# Patient Record
Sex: Female | Born: 1940
Health system: Southern US, Community
[De-identification: ages and names within clinical notes are randomized; demographics above are authoritative.]

## PROBLEM LIST (undated history)

## (undated) DIAGNOSIS — I10 Essential (primary) hypertension: Secondary | ICD-10-CM

## (undated) DIAGNOSIS — Z9289 Personal history of other medical treatment: Secondary | ICD-10-CM

## (undated) DIAGNOSIS — F32A Depression, unspecified: Secondary | ICD-10-CM

## (undated) DIAGNOSIS — I493 Ventricular premature depolarization: Secondary | ICD-10-CM

## (undated) DIAGNOSIS — E669 Obesity, unspecified: Secondary | ICD-10-CM

## (undated) DIAGNOSIS — I77810 Thoracic aortic ectasia: Secondary | ICD-10-CM

## (undated) DIAGNOSIS — E663 Overweight: Secondary | ICD-10-CM

## (undated) DIAGNOSIS — E7800 Pure hypercholesterolemia, unspecified: Secondary | ICD-10-CM

## (undated) DIAGNOSIS — M549 Dorsalgia, unspecified: Secondary | ICD-10-CM

## (undated) DIAGNOSIS — I480 Paroxysmal atrial fibrillation: Secondary | ICD-10-CM

## (undated) DIAGNOSIS — F329 Major depressive disorder, single episode, unspecified: Secondary | ICD-10-CM

## (undated) DIAGNOSIS — F419 Anxiety disorder, unspecified: Secondary | ICD-10-CM

## (undated) DIAGNOSIS — I5189 Other ill-defined heart diseases: Secondary | ICD-10-CM

## (undated) DIAGNOSIS — I482 Chronic atrial fibrillation, unspecified: Secondary | ICD-10-CM

## (undated) HISTORY — DX: Obesity, unspecified: E66.9

## (undated) HISTORY — PX: ABDOMINAL SURGERY: SHX537

## (undated) HISTORY — DX: Thoracic aortic ectasia: I77.810

## (undated) HISTORY — DX: Paroxysmal atrial fibrillation: I48.0

## (undated) HISTORY — DX: Chronic atrial fibrillation, unspecified: I48.20

## (undated) HISTORY — DX: Other ill-defined heart diseases: I51.89

## (undated) HISTORY — DX: Overweight: E66.3

## (undated) HISTORY — DX: Personal history of other medical treatment: Z92.89

## (undated) HISTORY — DX: Dorsalgia, unspecified: M54.9

---

## 2001-08-27 ENCOUNTER — Encounter: Payer: Self-pay | Admitting: Internal Medicine

## 2001-08-27 ENCOUNTER — Ambulatory Visit (HOSPITAL_COMMUNITY): Admission: RE | Admit: 2001-08-27 | Discharge: 2001-08-27 | Payer: Self-pay | Admitting: Internal Medicine

## 2004-05-22 ENCOUNTER — Emergency Department (HOSPITAL_COMMUNITY): Admission: EM | Admit: 2004-05-22 | Discharge: 2004-05-22 | Payer: Self-pay | Admitting: Emergency Medicine

## 2004-05-27 ENCOUNTER — Encounter: Admission: RE | Admit: 2004-05-27 | Discharge: 2004-05-27 | Payer: Self-pay | Admitting: Specialist

## 2008-04-14 ENCOUNTER — Emergency Department (HOSPITAL_COMMUNITY): Admission: EM | Admit: 2008-04-14 | Discharge: 2008-04-14 | Payer: Self-pay | Admitting: Emergency Medicine

## 2009-01-30 ENCOUNTER — Emergency Department (HOSPITAL_COMMUNITY): Admission: EM | Admit: 2009-01-30 | Discharge: 2009-01-30 | Payer: Self-pay | Admitting: Emergency Medicine

## 2010-04-05 LAB — POCT I-STAT, CHEM 8
BUN: 20 mg/dL (ref 6–23)
HCT: 42 % (ref 36.0–46.0)
Hemoglobin: 14.3 g/dL (ref 12.0–15.0)

## 2010-04-05 LAB — POCT CARDIAC MARKERS: Troponin i, poc: <0.05 ng/mL (ref 0.00–0.09)

## 2010-07-08 ENCOUNTER — Other Ambulatory Visit: Payer: Self-pay | Admitting: Internal Medicine

## 2010-07-08 DIAGNOSIS — Z1231 Encounter for screening mammogram for malignant neoplasm of breast: Secondary | ICD-10-CM

## 2010-11-23 ENCOUNTER — Ambulatory Visit: Payer: Self-pay

## 2011-04-07 ENCOUNTER — Encounter (HOSPITAL_COMMUNITY): Payer: Self-pay | Admitting: Emergency Medicine

## 2011-04-07 ENCOUNTER — Emergency Department (HOSPITAL_COMMUNITY)
Admission: EM | Admit: 2011-04-07 | Discharge: 2011-04-08 | Disposition: A | Discharge status: Home / Self Care | Payer: Medicare Other | Attending: Emergency Medicine | Admitting: Emergency Medicine

## 2011-04-07 DIAGNOSIS — F341 Dysthymic disorder: Secondary | ICD-10-CM | POA: Insufficient documentation

## 2011-04-07 DIAGNOSIS — M25519 Pain in unspecified shoulder: Secondary | ICD-10-CM | POA: Insufficient documentation

## 2011-04-07 DIAGNOSIS — E7800 Pure hypercholesterolemia, unspecified: Secondary | ICD-10-CM | POA: Insufficient documentation

## 2011-04-07 DIAGNOSIS — I10 Essential (primary) hypertension: Secondary | ICD-10-CM | POA: Insufficient documentation

## 2011-04-07 DIAGNOSIS — K219 Gastro-esophageal reflux disease without esophagitis: Secondary | ICD-10-CM

## 2011-04-07 DIAGNOSIS — I4891 Unspecified atrial fibrillation: Secondary | ICD-10-CM | POA: Insufficient documentation

## 2011-04-07 DIAGNOSIS — Z7982 Long term (current) use of aspirin: Secondary | ICD-10-CM | POA: Insufficient documentation

## 2011-04-07 DIAGNOSIS — R109 Unspecified abdominal pain: Secondary | ICD-10-CM | POA: Insufficient documentation

## 2011-04-07 DIAGNOSIS — Z7901 Long term (current) use of anticoagulants: Secondary | ICD-10-CM | POA: Insufficient documentation

## 2011-04-07 DIAGNOSIS — Z79899 Other long term (current) drug therapy: Secondary | ICD-10-CM | POA: Insufficient documentation

## 2011-04-07 HISTORY — DX: Major depressive disorder, single episode, unspecified: F32.9

## 2011-04-07 HISTORY — DX: Anxiety disorder, unspecified: F41.9

## 2011-04-07 HISTORY — DX: Pure hypercholesterolemia, unspecified: E78.00

## 2011-04-07 HISTORY — DX: Depression, unspecified: F32.A

## 2011-04-07 HISTORY — DX: Essential (primary) hypertension: I10

## 2011-04-07 NOTE — ED Notes (Signed)
PT. REPORTS ELEVATED BLOOD PRESSURE AT HOME THIS EVENING = 160/102 , DENIES CHEST PAIN OR SOB . SEEN BY HIS PCP YESTERDAY STARTED ON COUMADIN , DENIES HEADACHE OR NAUSEA.

## 2011-04-08 LAB — BASIC METABOLIC PANEL
BUN: 15 mg/dL (ref 6–23)
CO2: 28 mEq/L (ref 19–32)
Chloride: 99 mEq/L (ref 96–112)
GFR calc non Af Amer: 84 mL/min — ABNORMAL LOW (ref >90)
Glucose, Bld: 100 mg/dL — ABNORMAL HIGH (ref 70–99)
Sodium: 138 mEq/L (ref 135–145)

## 2011-04-08 LAB — CBC
MCH: 27.9 pg (ref 26.0–34.0)
MCHC: 34.8 g/dL (ref 30.0–36.0)
MCV: 80.1 fL (ref 78.0–100.0)
Platelets: 379 10*3/uL (ref 150–400)
WBC: 11.2 10*3/uL — ABNORMAL HIGH (ref 4.0–10.5)

## 2011-04-08 LAB — DIFFERENTIAL
Basophils Absolute: 0.1 10*3/uL (ref 0.0–0.1)
Eosinophils Relative: 2 % (ref 0–5)
Lymphocytes Relative: 32 % (ref 12–46)
Monocytes Absolute: 0.8 10*3/uL (ref 0.1–1.0)
Neutro Abs: 6.5 10*3/uL (ref 1.7–7.7)
Neutrophils Relative %: 58 % (ref 43–77)

## 2011-04-08 LAB — POCT I-STAT TROPONIN I: Troponin i, poc: 0 ng/mL (ref 0.00–0.08)

## 2011-04-08 LAB — PROTIME-INR: INR: 1.14 (ref 0.00–1.49)

## 2011-04-08 MED ORDER — FAMOTIDINE 20 MG PO TABS: 20.0000 mg | ORAL_TABLET | Freq: Two times a day (BID) | ORAL | Status: DC | PRN

## 2011-04-08 MED ORDER — GI COCKTAIL ~~LOC~~
30.0000 mL | Freq: Once | ORAL | Status: AC
  Administered 2011-04-08: 30 mL via ORAL
  Filled 2011-04-08: qty 30

## 2011-04-08 NOTE — ED Notes (Signed)
rx x 1, pt voiced understanding to f/u with PCP today

## 2011-04-08 NOTE — ED Notes (Signed)
Pt c/o intermittent left arm aching that lasts for 15-20 min at a time.  Tonight, noticed elevated BP and was worried.  Was recently seen at PCP and dx with AFib and put on Coumadin.  Also hx of recent sinus infection, taking azithromycin.

## 2011-04-08 NOTE — Discharge Instructions (Signed)
Diet for GERD or PUD Nutrition therapy can help ease the discomfort of gastroesophageal reflux disease (GERD) and peptic ulcer disease (PUD).  HOME CARE INSTRUCTIONS   Eat your meals slowly, in a relaxed setting.   Eat 5 to 6 small meals per day.   If a food causes distress, stop eating it for a period of time.  FOODS TO AVOID  Coffee, regular or decaffeinated.   Cola beverages, regular or low calorie.   Tea, regular or decaffeinated.   Pepper.   Cocoa.   High fat foods, including meats.   Butter, margarine, hydrogenated oil (trans fats).   Peppermint or spearmint (if you have GERD).   Fruits and vegetables if not tolerated.   Alcohol.   Nicotine (smoking or chewing). This is one of the most potent stimulants to acid production in the gastrointestinal tract.   Any food that seems to aggravate your condition.  If you have questions regarding your diet, ask your caregiver or a registered dietitian. TIPS  Lying flat may make symptoms worse. Keep the head of your bed raised 6 to 9 inches (15 to 23 cm) by using a foam wedge or blocks under the legs of the bed.   Do not lay down until 3 hours after eating a meal.   Daily physical activity may help reduce symptoms.  MAKE SURE YOU:   Understand these instructions.   Will watch your condition.   Will get help right away if you are not doing well or get worse.  Document Released: 01/04/2005 Document Revised: 12/24/2010 Document Reviewed: 11/20/2010 [facility] Patient Information 2012 [place], [place].Gastroesophageal Reflux Disease, Adult Gastroesophageal reflux disease (GERD) happens when acid from your stomach flows up into the esophagus. When acid comes in contact with the esophagus, the acid causes soreness (inflammation) in the esophagus. Over time, GERD may create small holes (ulcers) in the lining of the esophagus. CAUSES   Increased body weight. This puts pressure on the stomach, making acid rise from the stomach into  the esophagus.   Smoking. This increases acid production in the stomach.   Drinking alcohol. This causes decreased pressure in the lower esophageal sphincter (valve or ring of muscle between the esophagus and stomach), allowing acid from the stomach into the esophagus.   Late evening meals and a full stomach. This increases pressure and acid production in the stomach.   A malformed lower esophageal sphincter.  Sometimes, no cause is found. SYMPTOMS   Burning pain in the lower part of the mid-chest behind the breastbone and in the mid-stomach area. This may occur twice a week or more often.   Trouble swallowing.   Sore throat.   Dry cough.   Asthma-like symptoms including chest tightness, shortness of breath, or wheezing.  DIAGNOSIS  Your caregiver may be able to diagnose GERD based on your symptoms. In some cases, X-rays and other tests may be done to check for complications or to check the condition of your stomach and esophagus. TREATMENT  Your caregiver may recommend over-the-counter or prescription medicines to help decrease acid production. Ask your caregiver before starting or adding any new medicines.  HOME CARE INSTRUCTIONS   Change the factors that you can control. Ask your caregiver for guidance concerning weight loss, quitting smoking, and alcohol consumption.   Avoid foods and drinks that make your symptoms worse, such as:   Caffeine or alcoholic drinks.   Chocolate.   Peppermint or mint flavorings.   Garlic and onions.   Spicy foods.  Citrus fruits, such as oranges, lemons, or limes.   Tomato-based foods such as sauce, chili, salsa, and pizza.   Fried and fatty foods.   Avoid lying down for the 3 hours prior to your bedtime or prior to taking a nap.   Eat small, frequent meals instead of large meals.   Wear loose-fitting clothing. Do not wear anything tight around your waist that causes pressure on your stomach.   Raise the head of your bed 6 to 8  inches with wood blocks to help you sleep. Extra pillows will not help.   Only take over-the-counter or prescription medicines for pain, discomfort, or fever as directed by your caregiver.   Do not take aspirin, ibuprofen, or other nonsteroidal anti-inflammatory drugs (NSAIDs).  SEEK IMMEDIATE MEDICAL CARE IF:   You have pain in your arms, neck, jaw, teeth, or back.   Your pain increases or changes in intensity or duration.   You develop nausea, vomiting, or sweating (diaphoresis).   You develop shortness of breath, or you faint.   Your vomit is green, yellow, black, or looks like coffee grounds or blood.   Your stool is red, bloody, or black.  These symptoms could be signs of other problems, such as heart disease, gastric bleeding, or esophageal bleeding. MAKE SURE YOU:   Understand these instructions.   Will watch your condition.   Will get help right away if you are not doing well or get worse.  Document Released: 10/14/2004 Document Revised: 12/24/2010 Document Reviewed: 07/24/2010 [facility] Patient Information 2012 [place], [place].Heartburn Heartburn is a painful, burning sensation in the chest. It may feel worse in certain positions, such as lying down or bending over. It is caused by stomach acid backing up into the tube that carries food from the mouth down to the stomach (lower esophagus).  CAUSES   Large meals.   Certain foods and drinks.   Exercise.   Increased acid production.   Being overweight or obese.   Certain medicines.  SYMPTOMS   Burning pain in the chest or lower throat.   Bitter taste in the mouth.   Coughing.  DIAGNOSIS  If the usual treatments for heartburn do not improve your symptoms, then tests may be done to see if there is another condition present. Possible tests may include:  X-rays.   Endoscopy. This is when a tube with a light and a camera on the end is used to examine the esophagus and the stomach.   A test to measure the amount  of acid in the esophagus (pH test).   A test to see if the esophagus is working properly (esophageal manometry).   Blood, breath, or stool tests to check for bacteria that cause ulcers.  TREATMENT   Your caregiver may tell you to use certain over-the-counter medicines (antacids, acid reducers) for mild heartburn.   Your caregiver may prescribe medicines to decrease the acid in your stomach or protect your stomach lining.   Your caregiver may recommend certain diet changes.   For severe cases, your caregiver may recommend that the head of your bed be elevated on blocks. (Sleeping with more pillows is not an effective treatment as it only changes the position of your head and does not improve the main problem of stomach acid refluxing into the esophagus.)  HOME CARE INSTRUCTIONS   Take all medicines as directed by your caregiver.   Raise the head of your bed by putting blocks under the legs if instructed to by your caregiver.  Do not exercise right after eating.   Avoid eating 2 or 3 hours before bed. Do not lie down right after eating.   Eat small meals throughout the day instead of 3 large meals.   Stop smoking if you smoke.   Maintain a healthy weight.   Identify foods and beverages that make your symptoms worse and avoid them. Foods you may want to avoid include:   Peppers.   Chocolate.   High-fat foods, including fried foods.   Spicy foods.   Garlic and onions.   Citrus fruits, including oranges, grapefruit, lemons, and limes.   Food containing tomatoes or tomato products.   Mint.   Carbonated drinks, caffeinated drinks, and alcohol.   Vinegar.  SEEK IMMEDIATE MEDICAL CARE IF:  You have severe chest pain that goes down your arm or into your jaw or neck.   You feel sweaty, dizzy, or lightheaded.   You are short of breath.   You vomit blood.   You have difficulty or pain with swallowing.   You have bloody or black, tarry stools.   You have episodes  of heartburn more than 3 times a week for more than 2 weeks.  MAKE SURE YOU:  Understand these instructions.   Will watch your condition.   Will get help right away if you are not doing well or get worse.  Document Released: 05/23/2008 Document Revised: 12/24/2010 Document Reviewed: 06/21/2010 [facility] Patient Information 2012 [place], [place].Hypertension Information As your heart beats, it forces blood through your arteries. This force is your blood pressure. If the pressure is too high, it is called hypertension (HTN) or high blood pressure. HTN is dangerous because you may have it and not know it. High blood pressure may mean that your heart has to work harder to pump blood. Your arteries may be narrow or stiff. The extra work puts you at risk for heart disease, stroke, and other problems.  Blood pressure consists of two numbers, a higher number over a lower, 110/72, for example. It is stated as "110 over 72." The ideal is below 120 for the top number (systolic) and under 80 for the bottom (diastolic).  You should pay close attention to your blood pressure if you have certain conditions such as:  Heart failure.   Prior heart attack.   Diabetes   Chronic kidney disease.   Prior stroke.   Multiple risk factors for heart disease.  To see if you have HTN, your blood pressure should be measured while you are seated with your arm held at the level of the heart. It should be measured at least twice. A one-time elevated blood pressure reading (especially in the Emergency Department) does not mean that you need treatment. There may be conditions in which the blood pressure is different between your right and left arms. It is important to see your caregiver soon for a recheck. Most people have essential hypertension which means that there is not a specific cause. This type of high blood pressure may be lowered by changing lifestyle factors such as:  Stress.   Smoking.   Lack of exercise.    Excessive weight.   Drug/tobacco/alcohol use.   Eating less salt.  Most people do not have symptoms from high blood pressure until it has caused damage to the body. Effective treatment can often prevent, delay or reduce that damage. TREATMENT  Treatment for high blood pressure, when a cause has been identified, is directed at the cause. There are a large number  of medications to treat HTN. These fall into several categories, and your caregiver will help you select the medicines that are best for you. Medications may have side effects. You should review side effects with your caregiver. If your blood pressure stays high after you have made lifestyle changes or started on medicines,   Your medication(s) may need to be changed.   Other problems may need to be addressed.   Be certain you understand your prescriptions, and know how and when to take your medicine.   Be sure to follow up with your caregiver within the time frame advised (usually within two weeks) to have your blood pressure rechecked and to review your medications.   If you are taking more than one medicine to lower your blood pressure, make sure you know how and at what times they should be taken. Taking two medicines at the same time can result in blood pressure that is too low.  Document Released: 03/09/2005 Document Revised: 09/16/2010 Document Reviewed: 03/16/2007 [facility] Patient Information 2012 [place], [place].

## 2011-04-11 ENCOUNTER — Encounter (HOSPITAL_COMMUNITY): Payer: Self-pay | Admitting: *Deleted

## 2011-04-11 ENCOUNTER — Emergency Department (HOSPITAL_COMMUNITY)
Admission: EM | Admit: 2011-04-11 | Discharge: 2011-04-12 | Disposition: A | Discharge status: Home / Self Care | Payer: Medicare Other | Attending: Emergency Medicine | Admitting: Emergency Medicine

## 2011-04-11 DIAGNOSIS — Z7901 Long term (current) use of anticoagulants: Secondary | ICD-10-CM | POA: Insufficient documentation

## 2011-04-11 DIAGNOSIS — I10 Essential (primary) hypertension: Secondary | ICD-10-CM | POA: Insufficient documentation

## 2011-04-11 DIAGNOSIS — Z79899 Other long term (current) drug therapy: Secondary | ICD-10-CM | POA: Insufficient documentation

## 2011-04-11 NOTE — ED Provider Notes (Signed)
History     CSN: [id number]  Arrival date & time 04/07/11  2345   First MD Initiated Contact with Patient 04/08/11 [phone]      Chief Complaint  Patient presents with  . Hypertension    (Consider location/radiation/quality/duration/timing/severity/associated sxs/prior treatment) HPI Comments: The patient presents for evaluation of elevated blood pressure found incidentally at home.  She reports occasional vague ache of left shoulder and "upset stomach, like gas pains" at home as well.  She denies dyspnea, nausea, diaphoresis, chest pain, palpitations, or lightheadedness, syncope or near-syncope.  The patient was recently diagnosed with atrial fibrillation by her PCP approx. 3-4 days ago.  She was taken off of her HCTZ and her metoprolol was increased, she was started on warfarin, and set up with a cardiology appointment for tomorrow.  She is in no distress, reporting only "upset stomach" at the time of evaluation in the ED.  Patient is a 71 y.o. female presenting with hypertension.  Hypertension Pertinent negatives include no chest pain, no headaches and no shortness of breath.    Past Medical History  Diagnosis Date  . Hypertension   . Hypercholesteremia   . Depression   . Anxiety     History reviewed. No pertinent past surgical history.  No family history on file.  History  Substance Use Topics  . Smoking status: Never Smoker   . Smokeless tobacco: Not on file  . Alcohol Use: No    OB History    Grav Para Term Preterm Abortions TAB SAB Ect Mult Living                  Review of Systems  Constitutional: Negative for diaphoresis, activity change, fatigue and unexpected weight change.  HENT: Negative for hearing loss, nosebleeds and tinnitus.   Eyes: Negative for visual disturbance.  Respiratory: Negative for cough, chest tightness, shortness of breath and wheezing.   Cardiovascular: Negative for chest pain, palpitations and leg swelling.  Gastrointestinal: Negative.    Genitourinary: Negative for hematuria.  Musculoskeletal: Negative.   Skin: Negative.   Neurological: Negative for dizziness, tremors, seizures, syncope, facial asymmetry, speech difficulty, weakness, light-headedness, numbness and headaches.  Hematological: Does not bruise/bleed easily.  Psychiatric/Behavioral: Negative.     Allergies  Review of patient's allergies indicates no known allergies.  Home Medications   Current Outpatient Rx  Name Route Sig Dispense Refill  . ACETAMINOPHEN 325 MG PO TABS Oral Take 650 mg by mouth 2 (two) times daily. For pain    . ALPRAZOLAM 0.25 MG PO TABS Oral Take 0.125 mg by mouth daily as needed. For anxiety    . ASPIRIN EC 81 MG PO TBEC Oral Take 81 mg by mouth daily.    . AZITHROMYCIN 250 MG PO TABS Oral Take 250 mg by mouth daily.    Marland Kitchen CITALOPRAM HYDROBROMIDE 20 MG PO TABS Oral Take 20 mg by mouth daily.    Marland Kitchen SIMVASTATIN 20 MG PO TABS Oral Take 20 mg by mouth every evening.    . WARFARIN SODIUM 5 MG PO TABS Oral Take 5 mg by mouth daily.    Marland Kitchen FAMOTIDINE 20 MG PO TABS Oral Take 1 tablet (20 mg total) by mouth 2 (two) times daily as needed for heartburn (upset stomach). 14 tablet 0    BP 125/79  Pulse 70  Temp(Src) 98.3 F (36.8 C) (Oral)  Resp 16  SpO2 96%  Physical Exam  Nursing note and vitals reviewed. Constitutional: She is oriented to person, place, and time.  She appears well-developed and well-nourished. No distress.  HENT:  Head: Normocephalic and atraumatic.  Right Ear: External ear normal.  Left Ear: External ear normal.  Nose: Nose normal.  Mouth/Throat: Oropharynx is clear and moist.  Eyes: Conjunctivae, EOM and lids are normal. Pupils are equal, round, and reactive to light.  Fundoscopic exam:      The right eye shows no exudate, no hemorrhage and no papilledema.       The left eye shows no exudate, no hemorrhage and no papilledema.  Neck: Normal range of motion. Neck supple. No JVD present. No tracheal deviation present.    Cardiovascular: Normal rate, regular rhythm, S1 normal, S2 normal, normal heart sounds and intact distal pulses.  Exam reveals no gallop and no friction rub.   No murmur heard. Pulmonary/Chest: Effort normal and breath sounds normal. No stridor. No respiratory distress. She has no wheezes. She has no rales.  Abdominal: Soft. Bowel sounds are normal. She exhibits no distension. There is no tenderness.  Musculoskeletal: Normal range of motion. She exhibits no edema and no tenderness.  Neurological: She is alert and oriented to person, place, and time. She has normal reflexes. She displays no tremor. No cranial nerve deficit or sensory deficit. She exhibits normal muscle tone. Coordination and gait normal. GCS eye subscore is 4. GCS verbal subscore is 5. GCS motor subscore is 6.  Skin: Skin is warm and dry. No rash noted. She is not diaphoretic. No erythema. No pallor.  Psychiatric: She has a normal mood and affect. Her behavior is normal. Judgment and thought content normal.    ED Course  Procedures (including critical care time)   Date: 04/11/2011  Rate: 68  Rhythm: normal sinus rhythm  QRS Axis: normal  Intervals: normal  ST/T Wave abnormalities: nonspecific ST/T changes  Conduction Disutrbances:none  Narrative Interpretation: Non-provocative EKG  Old EKG Reviewed: unchanged    Labs Reviewed  CBC - Abnormal; Notable for the following:    WBC 11.2 (*)    All other components within normal limits  BASIC METABOLIC PANEL - Abnormal; Notable for the following:    Glucose, Bld 100 (*)    GFR calc non Af Amer 84 (*)    All other components within normal limits  DIFFERENTIAL  PROTIME-INR  APTT  POCT I-STAT TROPONIN I  LAB REPORT - SCANNED   No results found.   1. Hypertension   2. GERD (gastroesophageal reflux disease)       MDM  The patient's primary concern prompting tonight's visit was her blood pressure, which currently shows mild-moderate hypertension but in and of  itself is not worrisome for causing any acute end organ damage.  The extent of her hypertension does not warrant emergency antihypertensive administration nor change in her long term antihypertensive management, but rather, that of follow up with her PCP for re-check outside of the ED and any management changes to be made in that venue.  She appears to be out of atrial fibrilation, possibly due to her increase in metoprolol vs. Paroxysmal atrial fibrillation.  I have directed her to follow up with cardiology as scheduled for further evaluation and management.  She demonstrates no signs of myocardial ischemia/infarction.  While her complaints of epigastric discomfort (mild in severity) and fleeting, intermittent left shoulder discomfort are given credence for possible atypical MI symptoms, my evaluation at this time does not suggest that this is the case.        Felisa Bonier, MD 04/11/11 [phone]

## 2011-04-11 NOTE — ED Notes (Signed)
Pt measured her BP and 187/98 and she decided to come be evaluated here.  No symptoms with this.  Pt is alert and oriented, no distress

## 2011-04-12 LAB — CBC
HCT: 37.8 % (ref 36.0–46.0)
Hemoglobin: 12.8 g/dL (ref 12.0–15.0)
MCV: 81.6 fL (ref 78.0–100.0)

## 2011-04-12 LAB — BASIC METABOLIC PANEL
Calcium: 9.4 mg/dL (ref 8.4–10.5)
Creatinine, Ser: 0.78 mg/dL (ref 0.50–1.10)
GFR calc Af Amer: >90 mL/min (ref >90)
GFR calc non Af Amer: 84 mL/min — ABNORMAL LOW (ref >90)
Sodium: 136 mEq/L (ref 135–145)

## 2011-04-12 LAB — DIFFERENTIAL
Basophils Absolute: 0.1 10*3/uL (ref 0.0–0.1)
Eosinophils Absolute: 0.2 10*3/uL (ref 0.0–0.7)
Lymphs Abs: 3.6 10*3/uL (ref 0.7–4.0)
Monocytes Absolute: 0.9 10*3/uL (ref 0.1–1.0)
Monocytes Relative: 8 % (ref 3–12)

## 2011-04-12 LAB — URINALYSIS, ROUTINE W REFLEX MICROSCOPIC
Bilirubin Urine: NEGATIVE
Ketones, ur: NEGATIVE mg/dL
Nitrite: NEGATIVE
Protein, ur: NEGATIVE mg/dL
Specific Gravity, Urine: 1.013 (ref 1.005–1.030)
pH: 5.5 (ref 5.0–8.0)

## 2011-04-12 NOTE — ED Notes (Signed)
I gave the patient a warm blanket. 

## 2011-04-12 NOTE — Discharge Instructions (Signed)
Called Dr. polite tomorrow to be seen early this week. Return to ED for new or worsening symptoms. Arterial Hypertension Arterial hypertension (high blood pressure) is a condition of elevated pressure in your blood vessels. Hypertension over a long period of time is a risk factor for strokes, heart attacks, and heart failure. It is also the leading cause of kidney (renal) failure.  CAUSES   In Adults -- Over 90% of all hypertension has no known cause. This is called essential or primary hypertension. In the other 10% of people with hypertension, the increase in blood pressure is caused by another disorder. This is called secondary hypertension. Important causes of secondary hypertension are:   Heavy alcohol use.   Obstructive sleep apnea.   Hyperaldosterosim (Conn's syndrome).   Steroid use.   Chronic kidney failure.   Hyperparathyroidism.   Medications.   Renal artery stenosis.   Pheochromocytoma.   Cushing's disease.   Coarctation of the aorta.   Scleroderma renal crisis.   Licorice (in excessive amounts).   Drugs (cocaine, methamphetamine).  Your caregiver can explain any items above that apply to you.  In Children -- Secondary hypertension is more common and should always be considered.   Pregnancy -- Few women of childbearing age have high blood pressure. However, up to 10% of them develop hypertension of pregnancy. Generally, this will not harm the woman. It may be a sign of 3 complications of pregnancy: preeclampsia, HELLP syndrome, and eclampsia. Follow up and control with medication is necessary.  SYMPTOMS   This condition normally does not produce any noticeable symptoms. It is usually found during a routine exam.   Malignant hypertension is a late problem of high blood pressure. It may have the following symptoms:   Headaches.   Blurred vision.   End-organ damage (this means your kidneys, heart, lungs, and other organs are being damaged).   Stressful  situations can increase the blood pressure. If a person with normal blood pressure has their blood pressure go up while being seen by their caregiver, this is often termed "white coat hypertension." Its importance is not known. It may be related with eventually developing hypertension or complications of hypertension.   Hypertension is often confused with mental tension, stress, and anxiety.  DIAGNOSIS  The diagnosis is made by 3 separate blood pressure measurements. They are taken at least 1 week apart from each other. If there is organ damage from hypertension, the diagnosis may be made without repeat measurements. Hypertension is usually identified by having blood pressure readings:  Above 140/90 mmHg measured in both arms, at 3 separate times, over a couple weeks.   Over 130/80 mmHg should be considered a risk factor and may require treatment in patients with diabetes.  Blood pressure readings over 120/80 mmHg are called "pre-hypertension" even in non-diabetic patients. To get a true blood pressure measurement, use the following guidelines. Be aware of the factors that can alter blood pressure readings.  Take measurements at least 1 hour after caffeine.   Take measurements 30 minutes after smoking and without any stress. This is another reason to quit smoking - it raises your blood pressure.   Use a proper cuff size. Ask your caregiver if you are not sure about your cuff size.   Most home blood pressure cuffs are automatic. They will measure systolic and diastolic pressures. The systolic pressure is the pressure reading at the start of sounds. Diastolic pressure is the pressure at which the sounds disappear. If you are elderly, measure  pressures in multiple postures. Try sitting, lying or standing.   Sit at rest for a minimum of 5 minutes before taking measurements.   You should not be on any medications like decongestants. These are found in many cold medications.   Record your blood  pressure readings and review them with your caregiver.  If you have hypertension:  Your caregiver may do tests to be sure you do not have secondary hypertension (see "causes" above).   Your caregiver may also look for signs of metabolic syndrome. This is also called Syndrome X or Insulin Resistance Syndrome. You may have this syndrome if you have type 2 diabetes, abdominal obesity, and abnormal blood lipids in addition to hypertension.   Your caregiver will take your medical and family history and perform a physical exam.   Diagnostic tests may include blood tests (for glucose, cholesterol, potassium, and kidney function), a urinalysis, or an EKG. Other tests may also be necessary depending on your condition.  PREVENTION  There are important lifestyle issues that you can adopt to reduce your chance of developing hypertension:  Maintain a normal weight.   Limit the amount of salt (sodium) in your diet.   Exercise often.   Limit alcohol intake.   Get enough potassium in your diet. Discuss specific advice with your caregiver.   Follow a DASH diet (dietary approaches to stop hypertension). This diet is rich in fruits, vegetables, and low-fat dairy products, and avoids certain fats.  PROGNOSIS  Essential hypertension cannot be cured. Lifestyle changes and medical treatment can lower blood pressure and reduce complications. The prognosis of secondary hypertension depends on the underlying cause. Many people whose hypertension is controlled with medicine or lifestyle changes can live a normal, healthy life.  RISKS AND COMPLICATIONS  While high blood pressure alone is not an illness, it often requires treatment due to its short- and long-term effects on many organs. Hypertension increases your risk for:  CVAs or strokes (cerebrovascular accident).   Heart failure due to chronically high blood pressure (hypertensive cardiomyopathy).   Heart attack (myocardial infarction).   Damage to the  retina (hypertensive retinopathy).   Kidney failure (hypertensive nephropathy).  Your caregiver can explain list items above that apply to you. Treatment of hypertension can significantly reduce the risk of complications. TREATMENT   For overweight patients, weight loss and regular exercise are recommended. Physical fitness lowers blood pressure.   Mild hypertension is usually treated with diet and exercise. A diet rich in fruits and vegetables, fat-free dairy products, and foods low in fat and salt (sodium) can help lower blood pressure. Decreasing salt intake decreases blood pressure in a 1/3 of people.   Stop smoking if you are a smoker.  The steps above are highly effective in reducing blood pressure. While these actions are easy to suggest, they are difficult to achieve. Most patients with moderate or severe hypertension end up requiring medications to bring their blood pressure down to a normal level. There are several classes of medications for treatment. Blood pressure pills (antihypertensives) will lower blood pressure by their different actions. Lowering the blood pressure by 10 mmHg may decrease the risk of complications by as much as 25%. The goal of treatment is effective blood pressure control. This will reduce your risk for complications. Your caregiver will help you determine the best treatment for you according to your lifestyle. What is excellent treatment for one person, may not be for you. HOME CARE INSTRUCTIONS   Do not smoke.   Follow  the lifestyle changes outlined in the "Prevention" section.   If you are on medications, follow the directions carefully. Blood pressure medications must be taken as prescribed. Skipping doses reduces their benefit. It also puts you at risk for problems.   Follow up with your caregiver, as directed.   If you are asked to monitor your blood pressure at home, follow the guidelines in the "Diagnosis" section above.  SEEK MEDICAL CARE IF:    You think you are having medication side effects.   You have recurrent headaches or lightheadedness.   You have swelling in your ankles.   You have trouble with your vision.  SEEK IMMEDIATE MEDICAL CARE IF:   You have sudden onset of chest pain or pressure, difficulty breathing, or other symptoms of a heart attack.   You have a severe headache.   You have symptoms of a stroke (such as sudden weakness, difficulty speaking, difficulty walking).  MAKE SURE YOU:   Understand these instructions.   Will watch your condition.   Will get help right away if you are not doing well or get worse.  Document Released: 01/04/2005 Document Revised: 12/24/2010 Document Reviewed: 08/04/2006 [facility] Patient Information 2012 [place], [place].

## 2011-04-12 NOTE — ED Provider Notes (Signed)
History     CSN: [id number]  Arrival date & time 04/11/11  2222   First MD Initiated Contact with Patient 04/11/11 2348      Chief Complaint  Patient presents with  . Hypertension    (Consider location/radiation/quality/duration/timing/severity/associated sxs/prior treatment) HPI Comments: She presents from home for evaluation of blood pressure elevation. Incidentally. She took her blood pressure and found to be 187/98 decided be evaluated. She seen on March 20 for similar symptoms. Was increased on March 19, her hydrochlorothiazide was stopped and she was started on Coumadin for atrial fibrillation. She's had no further changes in her medications. Her blood pressure normally runs in the 130s over 80s. Denies any chest pain, shortness of breath, diaphoresis, palpitations, lightheadedness, vision changes or headache.  The history is provided by the patient.    Past Medical History  Diagnosis Date  . Hypertension   . Hypercholesteremia   . Depression   . Anxiety     History reviewed. No pertinent past surgical history.  No family history on file.  History  Substance Use Topics  . Smoking status: Never Smoker   . Smokeless tobacco: Not on file  . Alcohol Use: No    OB History    Grav Para Term Preterm Abortions TAB SAB Ect Mult Living                  Review of Systems  Constitutional: Negative for activity change and appetite change.  HENT: Negative for congestion and rhinorrhea.   Eyes: Negative for visual disturbance.  Respiratory: Negative for cough, chest tightness and shortness of breath.   Cardiovascular: Negative for chest pain.  Gastrointestinal: Negative for nausea, vomiting and abdominal pain.  Genitourinary: Negative for dysuria.  Musculoskeletal: Negative for back pain.  Neurological: Negative for headaches.    Allergies  Demerol  Home Medications   Current Outpatient Rx  Name Route Sig Dispense Refill  . ACETAMINOPHEN 325 MG PO TABS Oral Take  650 mg by mouth 2 (two) times daily. For pain    . ALPRAZOLAM 0.25 MG PO TABS Oral Take 0.125 mg by mouth daily as needed. For anxiety    . ASPIRIN EC 81 MG PO TBEC Oral Take 81 mg by mouth daily.    Marland Kitchen CITALOPRAM HYDROBROMIDE 20 MG PO TABS Oral Take 20 mg by mouth daily.    Marland Kitchen ESOMEPRAZOLE MAGNESIUM 40 MG PO CPDR Oral Take 40 mg by mouth daily before breakfast.    . SIMVASTATIN 20 MG PO TABS Oral Take 20 mg by mouth every evening.    . WARFARIN SODIUM 5 MG PO TABS Oral Take 5 mg by mouth daily.      BP 130/73  Pulse 66  Temp(Src) 98 F (36.7 C) (Oral)  Resp 17  SpO2 97%  Physical Exam  Constitutional: She is oriented to person, place, and time. She appears well-developed and well-nourished. No distress.  HENT:  Head: Normocephalic and atraumatic.  Mouth/Throat: Oropharynx is clear and moist. No oropharyngeal exudate.  Eyes: Conjunctivae are normal. Pupils are equal, round, and reactive to light.  Neck: Normal range of motion. Neck supple.  Cardiovascular: Normal rate, regular rhythm and normal heart sounds.   Pulmonary/Chest: Effort normal and breath sounds normal. No respiratory distress.  Abdominal: Soft. There is no tenderness. There is no rebound and no guarding.  Musculoskeletal: Normal range of motion. She exhibits no edema and no tenderness.  Neurological: She is alert and oriented to person, place, and time. No cranial nerve  deficit.  Skin: Skin is warm.    ED Course  Procedures (including critical care time)  Labs Reviewed  BASIC METABOLIC PANEL - Abnormal; Notable for the following:    GFR calc non Af Amer 84 (*)    All other components within normal limits  PROTIME-INR - Abnormal; Notable for the following:    Prothrombin Time 16.6 (*)    All other components within normal limits  CBC  DIFFERENTIAL  URINALYSIS, ROUTINE W REFLEX MICROSCOPIC  LAB REPORT - SCANNED   No results found.   1. Hypertension       MDM  Asymptomatic hypertension with history of  same. Recent medication changes by Dr. Nehemiah Settle. Normal exam with no suggestion of endorgan damage.  Moderate hypertension but not in worrisome range. Patient is not warranted emergency antihypertensive therapy.  No evidence of end organ damage.  Stable for followup with PCP.  Date: 04/12/2011  Rate: 73  Rhythm: normal sinus rhythm  QRS Axis: normal  Intervals: normal  ST/T Wave abnormalities: nonspecific ST/T changes  Conduction Disutrbances:none  Narrative Interpretation:   Old EKG Reviewed: unchanged        Glynn Octave, MD 04/12/11 1606

## 2011-04-16 ENCOUNTER — Encounter (HOSPITAL_COMMUNITY): Payer: Self-pay | Admitting: *Deleted

## 2011-04-16 ENCOUNTER — Emergency Department (HOSPITAL_COMMUNITY)
Admission: EM | Admit: 2011-04-16 | Discharge: 2011-04-16 | Disposition: A | Discharge status: Home / Self Care | Payer: Medicare Other | Attending: Emergency Medicine | Admitting: Emergency Medicine

## 2011-04-16 DIAGNOSIS — I10 Essential (primary) hypertension: Secondary | ICD-10-CM | POA: Insufficient documentation

## 2011-04-16 DIAGNOSIS — E7800 Pure hypercholesterolemia, unspecified: Secondary | ICD-10-CM | POA: Insufficient documentation

## 2011-04-16 NOTE — Discharge Instructions (Signed)
Hypertension Information As your heart beats, it forces blood through your arteries. This force is your blood pressure. If the pressure is too high, it is called hypertension (HTN) or high blood pressure. HTN is dangerous because you may have it and not know it. High blood pressure may mean that your heart has to work harder to pump blood. Your arteries may be narrow or stiff. The extra work puts you at risk for heart disease, stroke, and other problems.  Blood pressure consists of two numbers, a higher number over a lower, 110/72, for example. It is stated as "110 over 72." The ideal is below 120 for the top number (systolic) and under 80 for the bottom (diastolic).  You should pay close attention to your blood pressure if you have certain conditions such as:  Heart failure.   Prior heart attack.   Diabetes   Chronic kidney disease.   Prior stroke.   Multiple risk factors for heart disease.  To see if you have HTN, your blood pressure should be measured while you are seated with your arm held at the level of the heart. It should be measured at least twice. A one-time elevated blood pressure reading (especially in the Emergency Department) does not mean that you need treatment. There may be conditions in which the blood pressure is different between your right and left arms. It is important to see your caregiver soon for a recheck. Most people have essential hypertension which means that there is not a specific cause. This type of high blood pressure may be lowered by changing lifestyle factors such as:  Stress.   Smoking.   Lack of exercise.   Excessive weight.   Drug/tobacco/alcohol use.   Eating less salt.  Most people do not have symptoms from high blood pressure until it has caused damage to the body. Effective treatment can often prevent, delay or reduce that damage. TREATMENT  Treatment for high blood pressure, when a cause has been identified, is directed at the cause. There  are a large number of medications to treat HTN. These fall into several categories, and your caregiver will help you select the medicines that are best for you. Medications may have side effects. You should review side effects with your caregiver. If your blood pressure stays high after you have made lifestyle changes or started on medicines,   Your medication(s) may need to be changed.   Other problems may need to be addressed.   Be certain you understand your prescriptions, and know how and when to take your medicine.   Be sure to follow up with your caregiver within the time frame advised (usually within two weeks) to have your blood pressure rechecked and to review your medications.   If you are taking more than one medicine to lower your blood pressure, make sure you know how and at what times they should be taken. Taking two medicines at the same time can result in blood pressure that is too low.  Document Released: 03/09/2005 Document Revised: 09/16/2010 Document Reviewed: 03/16/2007 [employer] Patient Information 2012 [employer]. 

## 2011-04-16 NOTE — ED Provider Notes (Signed)
History     CSN: [id number]  Arrival date & time 04/16/11  1731   First MD Initiated Contact with Patient 04/16/11 1949      Chief Complaint  Patient presents with  . Hypertension    (Consider location/radiation/quality/duration/timing/severity/associated sxs/prior treatment) Patient is a 71 y.o. female presenting with hypertension. The history is provided by the patient.  Hypertension Pertinent negatives include no chest pain, no abdominal pain, no headaches and no shortness of breath.   patient came in for high blood pressure. She states she checked her blood pressure on a cough at home and found it was 160/106. No chest pain and no headaches. No numbness or weakness. She is wearing a Holter monitor or atrial fibrillation. She's been changed from hydrochlorothiazide to metoprolol. She states that she knows that her blood pressure was in a stroke level.  Past Medical History  Diagnosis Date  . Hypertension   . Hypercholesteremia   . Depression   . Anxiety     History reviewed. No pertinent past surgical history.  No family history on file.  History  Substance Use Topics  . Smoking status: Never Smoker   . Smokeless tobacco: Not on file  . Alcohol Use: No    OB History    Grav Para Term Preterm Abortions TAB SAB Ect Mult Living                  Review of Systems  Constitutional: Negative for activity change and appetite change.  HENT: Negative for neck stiffness.   Eyes: Negative for pain.  Respiratory: Negative for chest tightness and shortness of breath.   Cardiovascular: Positive for palpitations. Negative for chest pain and leg swelling.  Gastrointestinal: Negative for nausea, vomiting, abdominal pain and diarrhea.  Genitourinary: Negative for flank pain.  Musculoskeletal: Negative for back pain.  Skin: Negative for rash.  Neurological: Negative for weakness, numbness and headaches.  Psychiatric/Behavioral: Negative for behavioral problems.    Allergies    Demerol  Home Medications   Current Outpatient Rx  Name Route Sig Dispense Refill  . ASPIRIN EC 81 MG PO TBEC Oral Take 81 mg by mouth daily.    Marland Kitchen CITALOPRAM HYDROBROMIDE 20 MG PO TABS Oral Take 20 mg by mouth daily.    Marland Kitchen ESOMEPRAZOLE MAGNESIUM 40 MG PO CPDR Oral Take 40 mg by mouth daily before breakfast.    . METOPROLOL TARTRATE 25 MG PO TABS Oral Take 25 mg by mouth 2 (two) times daily.    Marland Kitchen SIMVASTATIN 20 MG PO TABS Oral Take 20 mg by mouth every evening.    . WARFARIN SODIUM 5 MG PO TABS Oral Take 5 mg by mouth daily.    . ACETAMINOPHEN 325 MG PO TABS Oral Take 650 mg by mouth 2 (two) times daily. For pain    . ALPRAZOLAM 0.25 MG PO TABS Oral Take 0.125 mg by mouth daily as needed. For anxiety      BP 143/77  Pulse 63  Temp(Src) 97.5 F (36.4 C) (Oral)  Resp 18  SpO2 100%  Physical Exam  Nursing note and vitals reviewed. Constitutional: She is oriented to person, place, and time. She appears well-developed and well-nourished.  HENT:  Head: Normocephalic and atraumatic.  Eyes: EOM are normal. Pupils are equal, round, and reactive to light.  Neck: Normal range of motion. Neck supple.  Cardiovascular: Normal rate, regular rhythm and normal heart sounds.   No murmur heard. Pulmonary/Chest: Effort normal and breath sounds normal. No respiratory distress.  She has no wheezes. She has no rales.  Abdominal: Soft. Bowel sounds are normal. She exhibits no distension. There is no tenderness. There is no rebound and no guarding.  Musculoskeletal: Normal range of motion.  Neurological: She is alert and oriented to person, place, and time. No cranial nerve deficit.  Skin: Skin is warm and dry.  Psychiatric: She has a normal mood and affect. Her speech is normal.    ED Course  Procedures (including critical care time)  Labs Reviewed - No data to display No results found.   1. Hypertension       MDM  Patient with high blood pressure. Asymptomatic. Reportedly elevated at  home but is improved here. She's had a recent change for blood pressure medicines. There is apparent endorgan damage. There is likely anxiety component to this 2. She has followup with her primary care Tuesday and will keep a blood pressure log until then        [employer]. Rubin Payor, MD 04/16/11 2059

## 2011-04-16 NOTE — ED Notes (Signed)
Pt is wearing a holter EKG since yesterday.  Today she checked her BP and found that it was elevated.  No CP or any distress with this.

## 2011-05-07 ENCOUNTER — Emergency Department (HOSPITAL_COMMUNITY)
Admission: EM | Admit: 2011-05-07 | Discharge: 2011-05-07 | Disposition: A | Discharge status: Home / Self Care | Payer: Medicare Other | Attending: Emergency Medicine | Admitting: Emergency Medicine

## 2011-05-07 ENCOUNTER — Encounter (HOSPITAL_COMMUNITY): Payer: Self-pay | Admitting: Emergency Medicine

## 2011-05-07 DIAGNOSIS — E7800 Pure hypercholesterolemia, unspecified: Secondary | ICD-10-CM | POA: Insufficient documentation

## 2011-05-07 DIAGNOSIS — Z79899 Other long term (current) drug therapy: Secondary | ICD-10-CM | POA: Insufficient documentation

## 2011-05-07 DIAGNOSIS — I10 Essential (primary) hypertension: Secondary | ICD-10-CM | POA: Insufficient documentation

## 2011-05-07 DIAGNOSIS — Z7982 Long term (current) use of aspirin: Secondary | ICD-10-CM | POA: Insufficient documentation

## 2011-05-07 DIAGNOSIS — F341 Dysthymic disorder: Secondary | ICD-10-CM | POA: Insufficient documentation

## 2011-05-07 DIAGNOSIS — I4891 Unspecified atrial fibrillation: Secondary | ICD-10-CM | POA: Insufficient documentation

## 2011-05-07 HISTORY — DX: Ventricular premature depolarization: I49.3

## 2011-05-07 LAB — CBC
MCH: 27.4 pg (ref 26.0–34.0)
MCHC: 33.2 g/dL (ref 30.0–36.0)
MCV: 82.6 fL (ref 78.0–100.0)
Platelets: 357 10*3/uL (ref 150–400)
RDW: 14.6 % (ref 11.5–15.5)

## 2011-05-07 LAB — BASIC METABOLIC PANEL
Calcium: 9.2 mg/dL (ref 8.4–10.5)
GFR calc non Af Amer: 65 mL/min — ABNORMAL LOW (ref >90)
Glucose, Bld: 86 mg/dL (ref 70–99)
Sodium: 133 mEq/L — ABNORMAL LOW (ref 135–145)

## 2011-05-07 LAB — DIFFERENTIAL
Basophils Absolute: 0.1 10*3/uL (ref 0.0–0.1)
Basophils Relative: 1 % (ref 0–1)
Eosinophils Absolute: 0.2 10*3/uL (ref 0.0–0.7)
Eosinophils Relative: 2 % (ref 0–5)
Lymphs Abs: 3.2 10*3/uL (ref 0.7–4.0)

## 2011-05-07 NOTE — Discharge Instructions (Signed)
FOLLOW UP WITH DR. POLITE PER SCHEDULED VISIT ON Monday OF NEXT WEEK. YOU CAN TAKE XANAX AS NEEDED, IF NEEDED. CONTINUE YOUR CURRENT MEDICATIONS AND RETURN HERE AS NEEDED.  Arterial Hypertension Arterial hypertension (high blood pressure) is a condition of elevated pressure in your blood vessels. Hypertension over a long period of time is a risk factor for strokes, heart attacks, and heart failure. It is also the leading cause of kidney (renal) failure.  CAUSES   In Adults -- Over 90% of all hypertension has no known cause. This is called essential or primary hypertension. In the other 10% of people with hypertension, the increase in blood pressure is caused by another disorder. This is called secondary hypertension. Important causes of secondary hypertension are:   Heavy alcohol use.   Obstructive sleep apnea.   Hyperaldosterosim (Conn's syndrome).   Steroid use.   Chronic kidney failure.   Hyperparathyroidism.   Medications.   Renal artery stenosis.   Pheochromocytoma.   Cushing's disease.   Coarctation of the aorta.   Scleroderma renal crisis.   Licorice (in excessive amounts).   Drugs (cocaine, methamphetamine).  Your caregiver can explain any items above that apply to you.  In Children -- Secondary hypertension is more common and should always be considered.   Pregnancy -- Few women of childbearing age have high blood pressure. However, up to 10% of them develop hypertension of pregnancy. Generally, this will not harm the woman. It may be a sign of 3 complications of pregnancy: preeclampsia, HELLP syndrome, and eclampsia. Follow up and control with medication is necessary.  SYMPTOMS   This condition normally does not produce any noticeable symptoms. It is usually found during a routine exam.   Malignant hypertension is a late problem of high blood pressure. It may have the following symptoms:   Headaches.   Blurred vision.   End-organ damage (this means your  kidneys, heart, lungs, and other organs are being damaged).   Stressful situations can increase the blood pressure. If a person with normal blood pressure has their blood pressure go up while being seen by their caregiver, this is often termed "white coat hypertension." Its importance is not known. It may be related with eventually developing hypertension or complications of hypertension.   Hypertension is often confused with mental tension, stress, and anxiety.  DIAGNOSIS  The diagnosis is made by 3 separate blood pressure measurements. They are taken at least 1 week apart from each other. If there is organ damage from hypertension, the diagnosis may be made without repeat measurements. Hypertension is usually identified by having blood pressure readings:  Above 140/90 mmHg measured in both arms, at 3 separate times, over a couple weeks.   Over 130/80 mmHg should be considered a risk factor and may require treatment in patients with diabetes.  Blood pressure readings over 120/80 mmHg are called "pre-hypertension" even in non-diabetic patients. To get a true blood pressure measurement, use the following guidelines. Be aware of the factors that can alter blood pressure readings.  Take measurements at least 1 hour after caffeine.   Take measurements 30 minutes after smoking and without any stress. This is another reason to quit smoking - it raises your blood pressure.   Use a proper cuff size. Ask your caregiver if you are not sure about your cuff size.   Most home blood pressure cuffs are automatic. They will measure systolic and diastolic pressures. The systolic pressure is the pressure reading at the start of sounds. Diastolic pressure  is the pressure at which the sounds disappear. If you are elderly, measure pressures in multiple postures. Try sitting, lying or standing.   Sit at rest for a minimum of 5 minutes before taking measurements.   You should not be on any medications like  decongestants. These are found in many cold medications.   Record your blood pressure readings and review them with your caregiver.  If you have hypertension:  Your caregiver may do tests to be sure you do not have secondary hypertension (see "causes" above).   Your caregiver may also look for signs of metabolic syndrome. This is also called Syndrome X or Insulin Resistance Syndrome. You may have this syndrome if you have type 2 diabetes, abdominal obesity, and abnormal blood lipids in addition to hypertension.   Your caregiver will take your medical and family history and perform a physical exam.   Diagnostic tests may include blood tests (for glucose, cholesterol, potassium, and kidney function), a urinalysis, or an EKG. Other tests may also be necessary depending on your condition.  PREVENTION  There are important lifestyle issues that you can adopt to reduce your chance of developing hypertension:  Maintain a normal weight.   Limit the amount of salt (sodium) in your diet.   Exercise often.   Limit alcohol intake.   Get enough potassium in your diet. Discuss specific advice with your caregiver.   Follow a DASH diet (dietary approaches to stop hypertension). This diet is rich in fruits, vegetables, and low-fat dairy products, and avoids certain fats.  PROGNOSIS  Essential hypertension cannot be cured. Lifestyle changes and medical treatment can lower blood pressure and reduce complications. The prognosis of secondary hypertension depends on the underlying cause. Many people whose hypertension is controlled with medicine or lifestyle changes can live a normal, healthy life.  RISKS AND COMPLICATIONS  While high blood pressure alone is not an illness, it often requires treatment due to its short- and long-term effects on many organs. Hypertension increases your risk for:  CVAs or strokes (cerebrovascular accident).   Heart failure due to chronically high blood pressure (hypertensive  cardiomyopathy).   Heart attack (myocardial infarction).   Damage to the retina (hypertensive retinopathy).   Kidney failure (hypertensive nephropathy).  Your caregiver can explain list items above that apply to you. Treatment of hypertension can significantly reduce the risk of complications. TREATMENT   For overweight patients, weight loss and regular exercise are recommended. Physical fitness lowers blood pressure.   Mild hypertension is usually treated with diet and exercise. A diet rich in fruits and vegetables, fat-free dairy products, and foods low in fat and salt (sodium) can help lower blood pressure. Decreasing salt intake decreases blood pressure in a 1/3 of people.   Stop smoking if you are a smoker.  The steps above are highly effective in reducing blood pressure. While these actions are easy to suggest, they are difficult to achieve. Most patients with moderate or severe hypertension end up requiring medications to bring their blood pressure down to a normal level. There are several classes of medications for treatment. Blood pressure pills (antihypertensives) will lower blood pressure by their different actions. Lowering the blood pressure by 10 mmHg may decrease the risk of complications by as much as 25%. The goal of treatment is effective blood pressure control. This will reduce your risk for complications. Your caregiver will help you determine the best treatment for you according to your lifestyle. What is excellent treatment for one person, may not be  for you. HOME CARE INSTRUCTIONS   Do not smoke.   Follow the lifestyle changes outlined in the "Prevention" section.   If you are on medications, follow the directions carefully. Blood pressure medications must be taken as prescribed. Skipping doses reduces their benefit. It also puts you at risk for problems.   Follow up with your caregiver, as directed.   If you are asked to monitor your blood pressure at home, follow  the guidelines in the "Diagnosis" section above.  SEEK MEDICAL CARE IF:   You think you are having medication side effects.   You have recurrent headaches or lightheadedness.   You have swelling in your ankles.   You have trouble with your vision.  SEEK IMMEDIATE MEDICAL CARE IF:   You have sudden onset of chest pain or pressure, difficulty breathing, or other symptoms of a heart attack.   You have a severe headache.   You have symptoms of a stroke (such as sudden weakness, difficulty speaking, difficulty walking).  MAKE SURE YOU:   Understand these instructions.   Will watch your condition.   Will get help right away if you are not doing well or get worse.  Document Released: 01/04/2005 Document Revised: 12/24/2010 Document Reviewed: 08/04/2006 [facility] Patient Information 2012 [place], [place].

## 2011-05-07 NOTE — ED Notes (Signed)
PT. REPORTS ELEVATED BLOOD PRESSURE THIS EVENING AT HOME = 167/109 , DENIERS PAIN OR DISCOMFORT , RESPIRATIONS UNLABORED , " I'M JUST SCARED".

## 2011-05-07 NOTE — ED Provider Notes (Signed)
History     CSN: [id number]  Arrival date & time 05/07/11  2009   First MD Initiated Contact with Patient 05/07/11 2130      Chief Complaint  Patient presents with  . Hypertension    (Consider location/radiation/quality/duration/timing/severity/associated sxs/prior treatment) Patient is a 71 y.o. female presenting with hypertension. The history is provided by the patient.  Hypertension This is a chronic problem. The current episode started today. The problem occurs constantly. The problem has been gradually improving. Pertinent negatives include no chest pain, chills or fever. Associated symptoms comments: She checked her blood pressure at home and found it to be elevated. She denies chest pain, nausea, SOB, vomiting, extremity swelling or pain of any kind. She reports she feels at her baseline health today. She also reports she was seen by her cardiologist today for previous A-fib episode thought caused by adverse medication effect, and was taken off coumadin and released from cardiology care. No palpitations today..    Past Medical History  Diagnosis Date  . Hypertension   . Hypercholesteremia   . Depression   . Anxiety   . Atrial fibrillation   . PVC (premature ventricular contraction)     History reviewed. No pertinent past surgical history.  No family history on file.  History  Substance Use Topics  . Smoking status: Never Smoker   . Smokeless tobacco: Not on file  . Alcohol Use: No    OB History    Grav Para Term Preterm Abortions TAB SAB Ect Mult Living                  Review of Systems  Constitutional: Negative for fever and chills.  Eyes: Negative.  Negative for visual disturbance.  Respiratory: Negative.  Negative for shortness of breath.   Cardiovascular: Negative.  Negative for chest pain and leg swelling.  Gastrointestinal: Negative.     Allergies  Demerol and Erythromycin  Home Medications   Current Outpatient Rx  Name Route Sig Dispense  Refill  . ACETAMINOPHEN 325 MG PO TABS Oral Take 325 mg by mouth 2 (two) times daily. For pain    . ALPRAZOLAM 0.25 MG PO TABS Oral Take 0.125 mg by mouth daily as needed. For anxiety    . ASPIRIN EC 81 MG PO TBEC Oral Take 81 mg by mouth daily.    Marland Kitchen CITALOPRAM HYDROBROMIDE 20 MG PO TABS Oral Take 20 mg by mouth daily.    Marland Kitchen ESOMEPRAZOLE MAGNESIUM 40 MG PO CPDR Oral Take 40 mg by mouth daily before breakfast.    . HYDROCHLOROTHIAZIDE 12.5 MG PO CAPS Oral Take 12.5 mg by mouth every morning.    Marland Kitchen METOPROLOL TARTRATE 25 MG PO TABS Oral Take 25 mg by mouth 2 (two) times daily.    Marland Kitchen SIMVASTATIN 20 MG PO TABS Oral Take 20 mg by mouth every evening.      BP 163/97  Pulse 66  Temp(Src) 98.3 F (36.8 C) (Oral)  Resp 18  SpO2 93%  Physical Exam  Constitutional: She appears well-developed and well-nourished.  HENT:  Head: Normocephalic.  Neck: Normal range of motion. Neck supple.  Cardiovascular: Normal rate and regular rhythm.   Pulmonary/Chest: Effort normal and breath sounds normal. She has no wheezes. She has no rales.  Abdominal: Soft. Bowel sounds are normal. There is no tenderness. There is no rebound and no guarding.  Musculoskeletal: Normal range of motion. She exhibits no edema.  Neurological: She is alert. She has normal reflexes. Coordination normal.  Skin: Skin is warm and dry.  Psychiatric: She has a normal mood and affect.    ED Course  Procedures (including critical care time) Results for orders placed during the hospital encounter of 05/07/11  CBC      Component Value Range   WBC 8.8  4.0 - 10.5 (K/uL)   RBC 4.53  3.87 - 5.11 (MIL/uL)   Hemoglobin 12.4  12.0 - 15.0 (g/dL)   HCT 78.4  69.6 - 29.5 (%)   MCV 82.6  78.0 - 100.0 (fL)   MCH 27.4  26.0 - 34.0 (pg)   MCHC 33.2  30.0 - 36.0 (g/dL)   RDW 28.4  13.2 - 44.0 (%)   Platelets 357  150 - 400 (K/uL)  DIFFERENTIAL      Component Value Range   Neutrophils Relative 52  43 - 77 (%)   Neutro Abs 4.6  1.7 - 7.7 (K/uL)    Lymphocytes Relative 37  12 - 46 (%)   Lymphs Abs 3.2  0.7 - 4.0 (K/uL)   Monocytes Relative 8  3 - 12 (%)   Monocytes Absolute 0.7  0.1 - 1.0 (K/uL)   Eosinophils Relative 2  0 - 5 (%)   Eosinophils Absolute 0.2  0.0 - 0.7 (K/uL)   Basophils Relative 1  0 - 1 (%)   Basophils Absolute 0.1  0.0 - 0.1 (K/uL)  BASIC METABOLIC PANEL      Component Value Range   Sodium 133 (*) 135 - 145 (mEq/L)   Potassium 3.6  3.5 - 5.1 (mEq/L)   Chloride 97  96 - 112 (mEq/L)   CO2 25  19 - 32 (mEq/L)   Glucose, Bld 86  70 - 99 (mg/dL)   BUN 18  6 - 23 (mg/dL)   Creatinine, Ser 1.02  0.50 - 1.10 (mg/dL)   Calcium 9.2  8.4 - 72.5 (mg/dL)   GFR calc non Af Amer 65 (*) >90 (mL/min)   GFR calc Af Amer 75 (*) >90 (mL/min)    Labs Reviewed  BASIC METABOLIC PANEL - Abnormal; Notable for the following:    Sodium 133 (*)    GFR calc non Af Amer 65 (*)    GFR calc Af Amer 75 (*)    All other components within normal limits  CBC  DIFFERENTIAL   No results found.   No diagnosis found.  1. Hypertension   MDM  The patient is anxious about blood pressure, which is improved here over readings at home. She was seen by cardiology today and cleared from their care. She has an appointment with her PCP (Pol[facility]) on Monday of next week. Feel she is stable for discharge without further emergency department management.        Rodena Medin, PA-C 05/07/11 2222

## 2011-05-08 NOTE — ED Provider Notes (Signed)
Medical screening examination/treatment/procedure(s) were performed by non-physician practitioner and as supervising physician I was immediately available for consultation/collaboration.   Aime Meloche A Verlie Liotta, MD 05/08/11 0022 

## 2012-06-13 ENCOUNTER — Encounter (HOSPITAL_COMMUNITY): Payer: Self-pay | Admitting: *Deleted

## 2012-06-13 DIAGNOSIS — I10 Essential (primary) hypertension: Secondary | ICD-10-CM | POA: Insufficient documentation

## 2012-06-13 DIAGNOSIS — E7800 Pure hypercholesterolemia, unspecified: Secondary | ICD-10-CM | POA: Insufficient documentation

## 2012-06-13 DIAGNOSIS — R42 Dizziness and giddiness: Secondary | ICD-10-CM | POA: Insufficient documentation

## 2012-06-13 DIAGNOSIS — Z79899 Other long term (current) drug therapy: Secondary | ICD-10-CM | POA: Insufficient documentation

## 2012-06-13 DIAGNOSIS — F329 Major depressive disorder, single episode, unspecified: Secondary | ICD-10-CM | POA: Insufficient documentation

## 2012-06-13 DIAGNOSIS — F3289 Other specified depressive episodes: Secondary | ICD-10-CM | POA: Insufficient documentation

## 2012-06-13 DIAGNOSIS — Z7982 Long term (current) use of aspirin: Secondary | ICD-10-CM | POA: Insufficient documentation

## 2012-06-13 DIAGNOSIS — F411 Generalized anxiety disorder: Secondary | ICD-10-CM | POA: Insufficient documentation

## 2012-06-13 DIAGNOSIS — I4949 Other premature depolarization: Secondary | ICD-10-CM | POA: Insufficient documentation

## 2012-06-13 DIAGNOSIS — R002 Palpitations: Secondary | ICD-10-CM | POA: Insufficient documentation

## 2012-06-13 DIAGNOSIS — I4891 Unspecified atrial fibrillation: Secondary | ICD-10-CM | POA: Insufficient documentation

## 2012-06-13 LAB — POCT I-STAT TROPONIN I: Troponin i, poc: 0 ng/mL (ref 0.00–0.08)

## 2012-06-13 LAB — CBC
Hemoglobin: 13 g/dL (ref 12.0–15.0)
MCHC: 33.9 g/dL (ref 30.0–36.0)
RDW: 14.1 % (ref 11.5–15.5)

## 2012-06-13 LAB — BASIC METABOLIC PANEL
BUN: 23 mg/dL (ref 6–23)
Creatinine, Ser: 0.9 mg/dL (ref 0.50–1.10)
GFR calc Af Amer: 73 mL/min — ABNORMAL LOW (ref >90)
GFR calc non Af Amer: 63 mL/min — ABNORMAL LOW (ref >90)
Potassium: 3.5 mEq/L (ref 3.5–5.1)

## 2012-06-13 NOTE — ED Notes (Signed)
Pt states she had a normal day and when she laid down she felt her heart racing and then got dizziness.  No speech or extremity deficits.  Pt has had episodes of atrial fibrillation but does not usually get dizzy.  Dizziness has resolved

## 2012-06-13 NOTE — ED Notes (Signed)
NURSE FIRST ROUNDS : NURSE EXPLAINED DELAY , WAIT TIME AND PROCESS , NO CHEST PAIN OR PALPITATIONS AT THIS TIME , RESPIRATIONS UNLABORED.

## 2012-06-13 NOTE — ED Notes (Signed)
Pt is in NSR at present

## 2012-06-14 ENCOUNTER — Emergency Department (HOSPITAL_COMMUNITY)
Admission: EM | Admit: 2012-06-14 | Discharge: 2012-06-14 | Disposition: A | Discharge status: Home / Self Care | Payer: Medicare Other | Attending: Emergency Medicine | Admitting: Emergency Medicine

## 2012-06-14 DIAGNOSIS — R002 Palpitations: Secondary | ICD-10-CM

## 2012-06-14 NOTE — ED Notes (Signed)
Pt reports acute onset of dizziness today while at rest, pt states she noted her pulse was very fast at that time and her BP was elevated. Pt denies syncope, chest pain, nausea or diaphoresis. Pt w/ hx of atrial fibrillation and takes 325mg  ASA daily - pt alert and oriented x4, in no acute distress - no neuro deficits noted, denies HA or blurred vision.

## 2012-06-14 NOTE — ED Provider Notes (Signed)
History     CSN: [id number]  Arrival date & time 06/13/12  4098   First MD Initiated Contact with Patient 06/14/12 0047      Chief Complaint  Patient presents with  . Palpitations  . Dizziness    (Consider location/radiation/quality/duration/timing/severity/associated sxs/prior treatment) HPI HX per PT - palpitations at home tonigth while at rest around 5pm lasted 1-2 minutes, no CP or SOB, some dizziness and she checked her BP and on her machine it read " A FIB" she has had this reading with symptoms before, was evaluated by cardiology and did wear a Holter monitor.  No new medications, no recent illness, symptoms mod in severity, now resolved.   Past Medical History  Diagnosis Date  . Hypertension   . Hypercholesteremia   . Depression   . Anxiety   . Atrial fibrillation   . PVC (premature ventricular contraction)     History reviewed. No pertinent past surgical history.  No family history on file.  History  Substance Use Topics  . Smoking status: Never Smoker   . Smokeless tobacco: Not on file  . Alcohol Use: No    OB History   Grav Para Term Preterm Abortions TAB SAB Ect Mult Living                  Review of Systems  Constitutional: Negative for fever and chills.  HENT: Negative for neck pain and neck stiffness.   Eyes: Negative for pain.  Respiratory: Negative for shortness of breath.   Cardiovascular: Positive for palpitations. Negative for chest pain.  Gastrointestinal: Negative for abdominal pain.  Genitourinary: Negative for dysuria.  Musculoskeletal: Negative for back pain.  Skin: Negative for rash.  Neurological: Negative for headaches.  All other systems reviewed and are negative.    Allergies  Demerol and Erythromycin  Home Medications   Current Outpatient Rx  Name  Route  Sig  Dispense  Refill  . acetaminophen (TYLENOL) 325 MG tablet   Oral   Take 325 mg by mouth 2 (two) times daily. For pain         . ALPRAZolam (XANAX) 0.25 MG  tablet   Oral   Take 0.125 mg by mouth daily as needed. For anxiety         . aspirin 325 MG tablet   Oral   Take 325 mg by mouth daily.         . citalopram (CELEXA) 20 MG tablet   Oral   Take 20 mg by mouth daily.         . hydrochlorothiazide (MICROZIDE) 12.5 MG capsule   Oral   Take 12.5 mg by mouth every morning.         . metoprolol tartrate (LOPRESSOR) 25 MG tablet   Oral   Take 25 mg by mouth 2 (two) times daily.         . simethicone (MYLICON) 125 MG chewable tablet   Oral   Chew 125 mg by mouth every 6 (six) hours as needed for flatulence.         . simvastatin (ZOCOR) 20 MG tablet   Oral   Take 20 mg by mouth every evening.           BP 139/85  Pulse 61  Temp(Src) 98.8 F (37.1 C) (Oral)  Resp 14  SpO2 98%  Physical Exam  Constitutional: She is oriented to person, place, and time. She appears well-developed and well-nourished.  HENT:  Head: Normocephalic and atraumatic.  Mouth/Throat: No oropharyngeal exudate.  Eyes: EOM are normal. Pupils are equal, round, and reactive to light. No scleral icterus.  Neck: Neck supple.  Cardiovascular: Normal rate, regular rhythm and intact distal pulses.   Pulmonary/Chest: Effort normal and breath sounds normal. No respiratory distress. She exhibits no tenderness.  Abdominal: Soft. Bowel sounds are normal.  Musculoskeletal: Normal range of motion. She exhibits no edema and no tenderness.  Neurological: She is alert and oriented to person, place, and time.  Skin: Skin is warm and dry.    ED Course  Procedures (including critical care time)  Results for orders placed during the hospital encounter of 06/14/12  BASIC METABOLIC PANEL      Result Value Range   Sodium 139  135 - 145 mEq/L   Potassium 3.5  3.5 - 5.1 mEq/L   Chloride 101  96 - 112 mEq/L   CO2 27  19 - 32 mEq/L   Glucose, Bld 120 (*) 70 - 99 mg/dL   BUN 23  6 - 23 mg/dL   Creatinine, Ser 1.19  0.50 - 1.10 mg/dL   Calcium 9.2  8.4 - 14.7  mg/dL   GFR calc non Af Amer 63 (*) >90 mL/min   GFR calc Af Amer 73 (*) >90 mL/min  CBC      Result Value Range   WBC 9.9  4.0 - 10.5 K/uL   RBC 4.63  3.87 - 5.11 MIL/uL   Hemoglobin 13.0  12.0 - 15.0 g/dL   HCT 82.9  56.2 - 13.0 %   MCV 82.9  78.0 - 100.0 fL   MCH 28.1  26.0 - 34.0 pg   MCHC 33.9  30.0 - 36.0 g/dL   RDW 86.5  78.4 - 69.6 %   Platelets 336  150 - 400 K/uL  POCT I-STAT TROPONIN I      Result Value Range   Troponin i, poc 0.00  0.00 - 0.08 ng/mL   Comment 3           POCT I-STAT TROPONIN I      Result Value Range   Troponin i, poc 0.01  0.00 - 0.08 ng/mL   Comment 3             Date: 06/14/2012  Rate: 73  Rhythm: normal sinus rhythm  QRS Axis: normal  Intervals: normal  ST/T Wave abnormalities: nonspecific ST changes  Conduction Disutrbances:none  Narrative Interpretation:   Old EKG Reviewed: unchanged  remains asymptomatic in the ER, ambulates no further dizziness or palpitations, no afib on cardiac monitor  1:46 AM d/w CAR Dr Mayford Knife, recs OK for d/c home and f/u in office  PT agrees to d/c and f/u instructions and strict return precautions  MDM   Palpitations resolved h/o afib on ASA, no other blood thinners  Labs, ECG, CAR consult  VS and nursing notes reviewed      Sunnie Nielsen, MD 06/14/12 0151

## 2012-06-14 NOTE — ED Notes (Signed)
Pt ambulating independently w/ steady gait on d/c in no acute distress, A&Ox4. D/c instructions reviewed w/ pt - pt denies any further questions or concerns at present.  

## 2012-08-01 ENCOUNTER — Encounter (HOSPITAL_COMMUNITY): Payer: Self-pay | Admitting: *Deleted

## 2012-08-01 ENCOUNTER — Emergency Department (HOSPITAL_COMMUNITY)
Admission: EM | Admit: 2012-08-01 | Discharge: 2012-08-01 | Disposition: A | Discharge status: Home / Self Care | Payer: Medicare Other | Attending: Emergency Medicine | Admitting: Emergency Medicine

## 2012-08-01 ENCOUNTER — Emergency Department (HOSPITAL_COMMUNITY): Payer: Medicare Other

## 2012-08-01 DIAGNOSIS — F3289 Other specified depressive episodes: Secondary | ICD-10-CM | POA: Insufficient documentation

## 2012-08-01 DIAGNOSIS — R51 Headache: Secondary | ICD-10-CM | POA: Insufficient documentation

## 2012-08-01 DIAGNOSIS — R42 Dizziness and giddiness: Secondary | ICD-10-CM | POA: Insufficient documentation

## 2012-08-01 DIAGNOSIS — F411 Generalized anxiety disorder: Secondary | ICD-10-CM | POA: Insufficient documentation

## 2012-08-01 DIAGNOSIS — Z79899 Other long term (current) drug therapy: Secondary | ICD-10-CM | POA: Insufficient documentation

## 2012-08-01 DIAGNOSIS — Z8679 Personal history of other diseases of the circulatory system: Secondary | ICD-10-CM | POA: Insufficient documentation

## 2012-08-01 DIAGNOSIS — Z7982 Long term (current) use of aspirin: Secondary | ICD-10-CM | POA: Insufficient documentation

## 2012-08-01 DIAGNOSIS — E7800 Pure hypercholesterolemia, unspecified: Secondary | ICD-10-CM | POA: Insufficient documentation

## 2012-08-01 DIAGNOSIS — F329 Major depressive disorder, single episode, unspecified: Secondary | ICD-10-CM | POA: Insufficient documentation

## 2012-08-01 LAB — CBC WITH DIFFERENTIAL/PLATELET
Basophils Absolute: 0.1 K/uL (ref 0.0–0.1)
Basophils Relative: 1 % (ref 0–1)
Eosinophils Absolute: 0.2 K/uL (ref 0.0–0.7)
Eosinophils Relative: 3 % (ref 0–5)
HCT: 36.5 % (ref 36.0–46.0)
Hemoglobin: 12.6 g/dL (ref 12.0–15.0)
Lymphocytes Relative: 34 % (ref 12–46)
Lymphs Abs: 3 K/uL (ref 0.7–4.0)
MCH: 28.3 pg (ref 26.0–34.0)
MCHC: 34.5 g/dL (ref 30.0–36.0)
MCV: 82 fL (ref 78.0–100.0)
Monocytes Absolute: 0.8 K/uL (ref 0.1–1.0)
Monocytes Relative: 9 % (ref 3–12)
Neutro Abs: 4.6 K/uL (ref 1.7–7.7)
Neutrophils Relative %: 53 % (ref 43–77)
Platelets: 326 K/uL (ref 150–400)
RBC: 4.45 MIL/uL (ref 3.87–5.11)
RDW: 14.2 % (ref 11.5–15.5)
WBC: 8.8 K/uL (ref 4.0–10.5)

## 2012-08-01 LAB — BASIC METABOLIC PANEL WITH GFR
BUN: 15 mg/dL (ref 6–23)
CO2: 28 meq/L (ref 19–32)
Calcium: 9.5 mg/dL (ref 8.4–10.5)
Chloride: 105 meq/L (ref 96–112)
Creatinine, Ser: 0.78 mg/dL (ref 0.50–1.10)
GFR calc Af Amer: >90 mL/min (ref >90)
GFR calc non Af Amer: 83 mL/min — ABNORMAL LOW (ref >90)
Glucose, Bld: 106 mg/dL — ABNORMAL HIGH (ref 70–99)
Potassium: 4.4 meq/L (ref 3.5–5.1)
Sodium: 141 meq/L (ref 135–145)

## 2012-08-01 NOTE — ED Notes (Signed)
Pt states High blood pressure, with light headedness, and chills

## 2012-08-01 NOTE — ED Notes (Signed)
Pt states that her blood pressure before arrival was 185/113, with a pulse rate of 89. These vitals were taken by a pharmacist at a store the pt was visiting.

## 2012-08-01 NOTE — ED Notes (Signed)
Pt states that she has been having a constant headache that she reports got worse when she started taking Xarelto.

## 2012-08-01 NOTE — ED Provider Notes (Signed)
History    CSN: [id number] Arrival date & time 08/01/12  2101  First MD Initiated Contact with Patient 08/01/12 2129     Chief Complaint  Patient presents with  . Hypertension   (Consider location/radiation/quality/duration/timing/severity/associated sxs/prior Treatment) Patient is a 72 y.o. female presenting with hypertension. The history is provided by the patient.  Hypertension Associated symptoms comments: KATHELINE BRENDLINGER is a 72 y.o. female h/o HTN, afib (on xarelto) here with presyncopal episode while at [facility].  Patients episode lasted only a few seconds then she sat down for relief.  She has been getting headaches daily while on xarelto for the past month.  She denies any neurological symptoms, blurry vision, CP, SOB.  ROs is otherwise negative..   Past Medical History  Diagnosis Date  . Hypertension   . Hypercholesteremia   . Depression   . Anxiety   . Atrial fibrillation   . PVC (premature ventricular contraction)    No past surgical history on file. No family history on file. History  Substance Use Topics  . Smoking status: Never Smoker   . Smokeless tobacco: Not on file  . Alcohol Use: No   OB History   Grav Para Term Preterm Abortions TAB SAB Ect Mult Living                 Review of Systems 10 Systems reviewed and are negative for acute change except as noted in the HPI.  Allergies  Demerol and Erythromycin  Home Medications   Current Outpatient Rx  Name  Route  Sig  Dispense  Refill  . acetaminophen (TYLENOL) 325 MG tablet   Oral   Take 325 mg by mouth 2 (two) times daily. For pain         . ALPRAZolam (XANAX) 0.25 MG tablet   Oral   Take 0.125 mg by mouth daily as needed. For anxiety         . aspirin 325 MG tablet   Oral   Take 325 mg by mouth daily.         . citalopram (CELEXA) 20 MG tablet   Oral   Take 20 mg by mouth daily.         . hydrochlorothiazide (MICROZIDE) 12.5 MG capsule   Oral   Take 12.5 mg by mouth every  morning.         . metoprolol tartrate (LOPRESSOR) 25 MG tablet   Oral   Take 25 mg by mouth 2 (two) times daily.         . simethicone (MYLICON) 125 MG chewable tablet   Oral   Chew 125 mg by mouth every 6 (six) hours as needed for flatulence.         . simvastatin (ZOCOR) 20 MG tablet   Oral   Take 20 mg by mouth every evening.          BP 152/93  Pulse 78  Temp(Src) 98.9 F (37.2 C) (Oral)  Resp 18  SpO2 94% Physical Exam  Nursing note and vitals reviewed. Constitutional: She is oriented to person, place, and time. She appears well-developed and well-nourished. No distress.  HENT:  Head: Normocephalic and atraumatic.  Eyes: Conjunctivae and EOM are normal. Pupils are equal, round, and reactive to light. No scleral icterus.  Neck: Normal range of motion. Neck supple. No JVD present. No tracheal deviation present. No thyromegaly present.  Cardiovascular: Normal rate, regular rhythm and normal heart sounds.  Exam reveals no gallop and no  friction rub.   No murmur heard. Pulmonary/Chest: Effort normal and breath sounds normal. No respiratory distress. She has no wheezes. She exhibits no tenderness.  Abdominal: Soft. Bowel sounds are normal. She exhibits no distension and no mass. There is no tenderness. There is no rebound and no guarding.  Musculoskeletal: Normal range of motion. She exhibits no edema and no tenderness.  Lymphadenopathy:    She has no cervical adenopathy.  Neurological: She is alert and oriented to person, place, and time.  Skin: Skin is warm and dry. No rash noted. No erythema. No pallor.    ED Course  Procedures (including critical care time) Labs Reviewed  CBC WITH DIFFERENTIAL  BASIC METABOLIC PANEL   Dg Chest 2 View  08/01/2012   *RADIOLOGY REPORT*  Clinical Data: Hypertension, atrial fibrillation  CHEST - 2 VIEW  Comparison: 01/30/2009  Findings: Normal heart size and vascularity.  No focal pneumonia, edema, collapse or consolidation.  No  effusion or pneumothorax. Trachea is midline.  Peripherally calcified breast implants noted bilaterally as before.  IMPRESSION: Stable chest exam.  No superimposed acute process   Original Report Authenticated By: Judie Petit. Miles Costain, M.D.   Ct Head Wo Contrast  08/01/2012   *RADIOLOGY REPORT*  Clinical Data: Recent frequent headaches and dizziness today.  CT HEAD WITHOUT CONTRAST  Technique:  Contiguous axial images were obtained from the base of the skull through the vertex without contrast.  Comparison: None.  Findings: The ventricles are normal in size and configuration. There are no parenchymal masses or mass effect.  Subtle hypoattenuation is noted in the periventricular white matter most evident adjacent to the frontal horns of the lateral ventricles. This is most consistent with minimal chronic microvascular ischemic change.  There are no other areas of abnormal parenchymal attenuation.  There are no extra-axial masses or abnormal fluid collections.  No evidence of a recent transcortical infarct.  No evidence of intracranial hemorrhage.  The visualized sinuses and mastoid air cells and middle ear cavities are clear.  IMPRESSION: No acute intracranial abnormalities.  Minimal chronic microvascular ischemic change.   Original Report Authenticated By: Amie Portland, M.D.   No diagnosis found.  MDM  Patient presents with presyncope of unclear ideology.  Her orthostats her were normal.  Along with her headache and being on xarelto, she is suspicious for having a brain bleed.  Her CT was negative as well.  Patient has been asymptomatic here in the ED throughout her stay.  She was encouraged to follow up with her PCP within one week for continued care, or return to the ED if her condition returns or worsens.  Patient is amendable with this plan.  Tomasita Crumble, MD 08/01/12 2317

## 2012-08-04 NOTE — ED Provider Notes (Signed)
I saw and evaluated the patient, reviewed the resident's note and I agree with the findings and plan.  Patient presents with mild headache, dizziness and elevated blood pressure. Workup was unremarkable here in the ER. Patient safe for discharge, followup with primary care doctor for repeat blood pressure check and medication adjustment.  Agree with resident's interpretation of EKG.  Gilda Crease, MD 08/04/12 [phone]

## 2012-09-29 ENCOUNTER — Other Ambulatory Visit: Payer: Self-pay | Admitting: Internal Medicine

## 2012-09-29 DIAGNOSIS — R1900 Intra-abdominal and pelvic swelling, mass and lump, unspecified site: Secondary | ICD-10-CM

## 2012-10-04 ENCOUNTER — Ambulatory Visit
Admission: RE | Admit: 2012-10-04 | Discharge: 2012-10-04 | Disposition: A | Discharge status: Home / Self Care | Payer: Medicare Other | Source: Ambulatory Visit | Attending: Internal Medicine | Admitting: Internal Medicine

## 2012-10-04 DIAGNOSIS — R1900 Intra-abdominal and pelvic swelling, mass and lump, unspecified site: Secondary | ICD-10-CM

## 2012-10-04 MED ORDER — IOHEXOL 300 MG/ML  SOLN
100.0000 mL | Freq: Once | INTRAMUSCULAR | Status: AC | PRN
  Administered 2012-10-04: 100 mL via INTRAVENOUS

## 2012-10-30 ENCOUNTER — Telehealth: Payer: Self-pay | Admitting: Cardiology

## 2012-10-30 NOTE — Telephone Encounter (Signed)
Pt was informed she can have flu shot/ pt accepting of information.

## 2012-10-30 NOTE — Telephone Encounter (Signed)
°  New problem     Patient would like to know if she can have the Flu Shot taking Xeralto.    Please give pt a call back.     Thanks!

## 2012-11-03 ENCOUNTER — Telehealth: Payer: Self-pay | Admitting: Cardiology

## 2012-11-03 NOTE — Telephone Encounter (Signed)
Called pt back and relayed that Dr. Mayford Knife talked to the hospital and ok'ed her being off xarelto for three days prior to procedure and that the odds of her producing a clot is slim. Pt verbalized understanding.

## 2012-11-03 NOTE — Telephone Encounter (Signed)
New Problem  Pt states she is going into the hospital.. Does not have a specified date// was instructed to stop taking Xarelto for 3 days prior. Her main concern is if she stops taking the Xarelto for 3 days will that create blood clots. Request a call back to discuss.

## 2012-11-17 ENCOUNTER — Ambulatory Visit: Payer: Medicare Other | Admitting: Gynecology

## 2012-12-28 ENCOUNTER — Encounter: Payer: Self-pay | Admitting: General Surgery

## 2012-12-28 DIAGNOSIS — I4891 Unspecified atrial fibrillation: Secondary | ICD-10-CM

## 2012-12-28 DIAGNOSIS — I10 Essential (primary) hypertension: Secondary | ICD-10-CM | POA: Insufficient documentation

## 2012-12-28 DIAGNOSIS — I4819 Other persistent atrial fibrillation: Secondary | ICD-10-CM | POA: Insufficient documentation

## 2012-12-29 ENCOUNTER — Encounter: Payer: Self-pay | Admitting: General Surgery

## 2012-12-29 ENCOUNTER — Encounter: Payer: Self-pay | Admitting: Cardiology

## 2012-12-29 ENCOUNTER — Encounter (INDEPENDENT_AMBULATORY_CARE_PROVIDER_SITE_OTHER): Payer: Self-pay

## 2012-12-29 ENCOUNTER — Ambulatory Visit (INDEPENDENT_AMBULATORY_CARE_PROVIDER_SITE_OTHER): Payer: Medicare Other | Admitting: Cardiology

## 2012-12-29 VITALS — BP 110/81 | HR 50 | Ht 64.0 in | Wt 172.0 lb

## 2012-12-29 DIAGNOSIS — I5189 Other ill-defined heart diseases: Secondary | ICD-10-CM

## 2012-12-29 DIAGNOSIS — I77810 Thoracic aortic ectasia: Secondary | ICD-10-CM

## 2012-12-29 DIAGNOSIS — I519 Heart disease, unspecified: Secondary | ICD-10-CM

## 2012-12-29 DIAGNOSIS — I4891 Unspecified atrial fibrillation: Secondary | ICD-10-CM

## 2012-12-29 DIAGNOSIS — I10 Essential (primary) hypertension: Secondary | ICD-10-CM

## 2012-12-29 MED ORDER — SALINE NASAL SPRAY 0.65 % NA SOLN: 1.0000 | Freq: Two times a day (BID) | NASAL | Status: DC

## 2012-12-29 NOTE — Patient Instructions (Signed)
Your physician has recommended you make the following change in your medication: Add OTC Nasal Saline Spray. 1 Spray to each nostral Twice a day. Call us if blood tinged nasal drainage does not resolve.  Your physician has requested that you have an echocardiogram. Echocardiography is a painless test that uses sound waves to create images of your heart. It provides your doctor with information about the size and shape of your heart and how well your heart's chambers and valves are working. This procedure takes approximately one hour. There are no restrictions for this procedure. Schedule for 06/2013  Your physician wants you to follow-up in: 6 Months with Dr Sherlyn Lick will receive a reminder letter in the mail two months in advance. If you don't receive a letter, please call our office to schedule the follow-up appointment.

## 2012-12-29 NOTE — Progress Notes (Signed)
[address] [place], [place]  [address] Phone: [phone] Fax:  [phone]  Date:  12/29/2012   ID:  Teresa White, DOB [birth year], MRN [id number]  PCP:  Katy Apo, MD  Cardiologist:  Armanda Magic, MD    History of Present Illness: Teresa White is a 72 y.o. female with a history of atrial fibrillation and HTN who presents today for followup.  She is doing well.  When I last saw her in June she was started on Xarelto for recurrent afib after a nuclear stress test.  She is doing well today.  She denies any chest pain, SOB, DOE, LE edema, dizziness, palpitations or syncope.  Since I saw her last she was found to have an abdominal mass.  She ultimately was diagnosed with 18x12cm ovarian cyst that was noncancerous. This was surgically removed and she has done well since then.   Wt Readings from Last 3 Encounters:  12/29/12 172 lb (78.019 kg)     Past Medical History  Diagnosis Date  . Hypertension   . Hypercholesteremia   . Depression   . Anxiety   . Overweight   . Back pain     history of abnormal MRI mild spinal stenosis at L3-4 and L4-5 small central disc herniation at L2-3 small L sided disc and Herniation at L1  . H/O echocardiogram 03/13    Normal EF and normal Stress test  . Atrial fibrillation   . PVC (premature ventricular contraction)   . Dilated aortic root   . Diastolic dysfunction     Current Outpatient Prescriptions  Medication Sig Dispense Refill  . acetaminophen (TYLENOL) 325 MG tablet Take 325 mg by mouth 2 (two) times daily. For pain      . acetaminophen (TYLENOL) 500 MG tablet 500 mg. daily      . ALPRAZolam (XANAX) 0.25 MG tablet Take 0.125 mg by mouth at bedtime.       Marland Kitchen aspirin 325 MG tablet Take 325 mg by mouth daily.      . citalopram (CELEXA) 20 MG tablet Take 20 mg by mouth daily.      . hydrochlorothiazide (MICROZIDE) 12.5 MG capsule Take 12.5 mg by mouth every morning.      . metoprolol (LOPRESSOR) 50 MG tablet Take  50 mg by mouth 2 (two) times daily.      . Rivaroxaban (XARELTO) 20 MG TABS Take 20 mg by mouth daily.      . simethicone (MYLICON) 125 MG chewable tablet Chew 125 mg by mouth daily as needed for flatulence. For gas      . simvastatin (ZOCOR) 20 MG tablet Take 20 mg by mouth every evening.       No current facility-administered medications for this visit.    Allergies:    Allergies  Allergen Reactions  . Azithromycin Other (See Comments)    Causes AFIB  . Demerol Nausea And Vomiting  . Erythromycin Other (See Comments)    "causes A-Fib"    Social History:  The patient  reports that she quit smoking about 14 years ago. Her smoking use included Cigarettes. She smoked 0.00 packs per day. She does not have any smokeless tobacco history on file. She reports that she does not drink alcohol or use illicit drugs.   Family History:  The patient's family history is not on file.   ROS:  Please see the history of present illness.      All other systems reviewed and negative.  PHYSICAL EXAM: VS:  BP 110/81  Pulse 50  Ht 5\' 4"  (1.626 m)  Wt 172 lb (78.019 kg)  BMI 29.51 kg/m2 Well nourished, well developed, in no acute distress HEENT: normal Neck: no JVD Cardiac:  normal S1, S2; RRR; no murmur Lungs:  clear to auscultation bilaterally, no wheezing, rhonchi or rales Abd: soft, nontender, no hepatomegaly Ext: no edema Skin: warm and dry Neuro:  CNs 2-12 intact, no focal abnormalities noted       ASSESSMENT AND PLAN:  1. PAF maintaining NSR  - continue Xarelto/metoprolol 2. HTN - well controlled  - continue HCTZ and metoprolol 3. Chronic anticoagulation  - she has had some blood tinged nasal discharge when blowing hard on her nose.  I have recommended that she get nasal saline spray and use 1 spray each nostril twice daily and if it continues to let me know.  She had recent NOAC labs done which are normal.  4.  Dilated aortic root  - repeat echo in June 2015  Followup with me in  6 months  Signed, Armanda Magic, MD 12/29/2012 9:51 AM

## 2013-01-02 ENCOUNTER — Telehealth: Payer: Self-pay | Admitting: Cardiology

## 2013-01-02 NOTE — Telephone Encounter (Signed)
Called stating she just noticed on her med list that she received on 12/12 that ASA is listed.  She states that Dr. Mayford Knife stopped that about 3-4 months ago when stared on Xarelto.  She wants the ASA taken off her list.  Will remove ASA but will send Dr. Mayford Knife notice that pt requested it to be removed "since it was stopped".

## 2013-01-02 NOTE — Telephone Encounter (Signed)
New problem    Pt's med list says Asprin.   Pt was told by Dr Mayford Knife to spot taking this.   Pt wants it removed from  Med list.

## 2013-01-22 ENCOUNTER — Encounter (HOSPITAL_COMMUNITY): Payer: Self-pay | Admitting: Emergency Medicine

## 2013-01-22 ENCOUNTER — Emergency Department (HOSPITAL_COMMUNITY)
Admission: EM | Admit: 2013-01-22 | Discharge: 2013-01-23 | Disposition: A | Discharge status: Home / Self Care | Payer: Medicare Other | Attending: Emergency Medicine | Admitting: Emergency Medicine

## 2013-01-22 DIAGNOSIS — Z87891 Personal history of nicotine dependence: Secondary | ICD-10-CM | POA: Insufficient documentation

## 2013-01-22 DIAGNOSIS — Z885 Allergy status to narcotic agent status: Secondary | ICD-10-CM | POA: Insufficient documentation

## 2013-01-22 DIAGNOSIS — I10 Essential (primary) hypertension: Secondary | ICD-10-CM | POA: Insufficient documentation

## 2013-01-22 DIAGNOSIS — M48061 Spinal stenosis, lumbar region without neurogenic claudication: Secondary | ICD-10-CM | POA: Insufficient documentation

## 2013-01-22 DIAGNOSIS — Z881 Allergy status to other antibiotic agents status: Secondary | ICD-10-CM | POA: Insufficient documentation

## 2013-01-22 DIAGNOSIS — F3289 Other specified depressive episodes: Secondary | ICD-10-CM | POA: Insufficient documentation

## 2013-01-22 DIAGNOSIS — I4891 Unspecified atrial fibrillation: Secondary | ICD-10-CM | POA: Insufficient documentation

## 2013-01-22 DIAGNOSIS — E663 Overweight: Secondary | ICD-10-CM | POA: Insufficient documentation

## 2013-01-22 DIAGNOSIS — Z7901 Long term (current) use of anticoagulants: Secondary | ICD-10-CM | POA: Insufficient documentation

## 2013-01-22 DIAGNOSIS — E7800 Pure hypercholesterolemia, unspecified: Secondary | ICD-10-CM | POA: Insufficient documentation

## 2013-01-22 DIAGNOSIS — F329 Major depressive disorder, single episode, unspecified: Secondary | ICD-10-CM | POA: Insufficient documentation

## 2013-01-22 DIAGNOSIS — F411 Generalized anxiety disorder: Secondary | ICD-10-CM | POA: Insufficient documentation

## 2013-01-22 DIAGNOSIS — I519 Heart disease, unspecified: Secondary | ICD-10-CM | POA: Insufficient documentation

## 2013-01-22 DIAGNOSIS — I77810 Thoracic aortic ectasia: Secondary | ICD-10-CM | POA: Insufficient documentation

## 2013-01-22 DIAGNOSIS — Z9189 Other specified personal risk factors, not elsewhere classified: Secondary | ICD-10-CM | POA: Insufficient documentation

## 2013-01-22 DIAGNOSIS — Z711 Person with feared health complaint in whom no diagnosis is made: Secondary | ICD-10-CM

## 2013-01-22 DIAGNOSIS — I498 Other specified cardiac arrhythmias: Secondary | ICD-10-CM | POA: Insufficient documentation

## 2013-01-22 DIAGNOSIS — Z79899 Other long term (current) drug therapy: Secondary | ICD-10-CM | POA: Insufficient documentation

## 2013-01-22 NOTE — ED Provider Notes (Signed)
CSN: [id number]     Arrival date & time 01/22/13  1918 History   None    This chart was scribed for non-physician practitioner, Earley FavorGail Jinelle Butchko, FNP working with Shon Batonourtney F Horton, MD by Arlan OrganAshley Leger, ED Scribe. This patient was seen in room TR11C/TR11C and the patient's care was started at 11:36 PM.   Chief Complaint  Patient presents with  . Hypertension   The history is provided by the patient. No language interpreter was used.    HPI Comments: Teresa White is a 73 y.o. female with multiple medical problems including HTN who presents to the Emergency Department complaining of HTN that first started earlier today. Pt reports having a low BP reading this morning of 99/67 when she woke up. She states she checked her BP again after in the afternoon , and reports a high BP of 182/109. Pt reports taking her BP medication [facility], and states her BP was back to normal. She denies any HA or blurred vision.  Past Medical History  Diagnosis Date  . Hypertension   . Hypercholesteremia   . Depression   . Anxiety   . Overweight   . Back pain     history of abnormal MRI mild spinal stenosis at L3-4 and L4-5 small central disc herniation at L2-3 small L sided disc and Herniation at L1  . H/O echocardiogram 03/13    Normal EF and normal Stress test  . Atrial fibrillation   . PVC (premature ventricular contraction)   . Dilated aortic root   . Diastolic dysfunction    Past Surgical History  Procedure Laterality Date  . Abdominal surgery     History reviewed. No pertinent family history. History  Substance Use Topics  . Smoking status: Former Smoker    Types: Cigarettes    Quit date: 01/18/1998  . Smokeless tobacco: Not on file  . Alcohol Use: No   OB History   Grav Para Term Preterm Abortions TAB SAB Ect Mult Living                 Review of Systems  Constitutional:       Positive for hypertension  Respiratory: Negative for shortness of breath.    Cardiovascular: Negative for chest pain and leg swelling.  Neurological: Negative for dizziness and headaches.  All other systems reviewed and are negative.    Allergies  Azithromycin; Demerol; and Erythromycin  Home Medications   Current Outpatient Rx  Name  Route  Sig  Dispense  Refill  . acetaminophen (TYLENOL) 325 MG tablet   Oral   Take 325 mg by mouth 2 (two) times daily. For pain         . acetaminophen (TYLENOL) 500 MG tablet   Oral   Take 500 mg by mouth every 6 (six) hours as needed for mild pain. daily         . ALPRAZolam (XANAX) 0.25 MG tablet   Oral   Take 0.125 mg by mouth at bedtime.          . hydrochlorothiazide (MICROZIDE) 12.5 MG capsule   Oral   Take 12.5 mg by mouth every morning.         . metoprolol (LOPRESSOR) 50 MG tablet   Oral   Take 50 mg by mouth 2 (two) times daily.         . Rivaroxaban (XARELTO) 20 MG TABS   Oral   Take 20 mg by mouth daily.         .Marland Kitchen  simethicone (MYLICON) 125 MG chewable tablet   Oral   Chew 125 mg by mouth daily as needed for flatulence. For gas         . simvastatin (ZOCOR) 20 MG tablet   Oral   Take 20 mg by mouth every evening.         . sodium chloride (EQ SALINE NASAL SPRAY) 0.65 % nasal spray   Nasal   Place 1 spray into the nose 2 (two) times daily. Call if blood tinged nasal drainage does not resolve          Triage Vitals: BP 154/82  Pulse 76  Temp(Src) 98.1 F (36.7 C) (Oral)  Resp 16  Ht 5\' 4"  (1.626 m)  Wt 168 lb (76.204 kg)  BMI 28.82 kg/m2  SpO2 96%  Physical Exam  Nursing note and vitals reviewed. Constitutional: She appears well-developed and well-nourished.  HENT:  Head: Normocephalic.  Neck: Normal range of motion.  Cardiovascular: Normal rate and regular rhythm.   Pulmonary/Chest: Effort normal.  Musculoskeletal: Normal range of motion.  Skin: Skin is warm.  Psychiatric: Her mood appears anxious.    ED Course  Procedures (including critical care  time)  DIAGNOSTIC STUDIES: Oxygen Saturation is 96% on RA, adequate by my interpretation.    COORDINATION OF CARE: 11:38 PM-Discussed treatment plan with pt at bedside and pt agreed to plan.     Labs Review Labs Reviewed - No data to display Imaging Review No results found.  EKG Interpretation   None       MDM   1. Hypertension   2. Worried well     Patient reassured  Instructed to only take BP twice a week and FU with her PCP  I personally performed the services described in this documentation, which was scribed in my presence. The recorded information has been reviewed and is accurate.  Arman Filter, NP 01/23/13 2046

## 2013-01-22 NOTE — ED Notes (Signed)
Pt reports hypertension today, has taken her prescribed antihypertensives - last BP 182/109 approx ago. Pt denies any headache or blurred vision.

## 2013-01-23 NOTE — Discharge Instructions (Signed)
How to Take Your Blood Pressure  These instructions are only for electronic home blood pressure machines. You will need:   An automatic or semi-automatic blood pressure machine.  Fresh batteries for the blood pressure machine. HOW DO I USE THESE TOOLS TO CHECK MY BLOOD PRESSURE?   There are 2 numbers that make up your blood pressure. For example: 120/80.  The first number (120 in our example) is called the "systolic pressure." It is a measure of the pressure in your blood vessels when your heart is pumping blood.  The second number (80 in our example) is called the "diastolic pressure." It is a measure of the pressure in your blood vessels when your heart is resting between beats.  Before you buy a home blood pressure machine, check the size of your arm so you can buy the right size cuff. Here is how to check the size of your arm:  Use a tape measure that shows both inches and centimeters.  Wrap the tape measure around the middle upper part of your arm. You may need someone to help you measure right.  Write down your arm measurement in both inches and centimeters.  To measure your blood pressure right, it is important to have the right size cuff.  If your arm is up to 13 inches (37 to 34 centimeters), get an adult cuff size.  If your arm is 13 to 17 inches (35 to 44 centimeters), get a large adult cuff size.  If your arm is 17 to 20 inches (45 to 52 centimeters), get an adult thigh cuff.  Try to rest or relax for at least 30 minutes before you check your blood pressure.  Do not smoke.  Do not have any drinks with caffeine, such as:  Pop.  Coffee.  Tea.  Check your blood pressure in a quiet room.  Sit down and stretch out your arm on a table. Keep your arm at about the level of your heart. Let your arm relax. GETTING BLOOD PRESSURE READINGS  Make sure you remove any tight-fighting clothing from your arm. Wrap the cuff around your upper arm. Wrap it just above the bend,  and above where you felt the pulse. You should be able to slip a finger between the cuff and your arm. If you cannot slip a finger in the cuff, it is too tight and should be removed and rewrapped.  Some units requires you to manually pump up the arm cuff.  Automatic units inflate the cuff when you press a button.  Cuff deflation is automatic in both models.  After the cuff is inflated, the unit measures your blood pressure and pulse. The readings are displayed on a monitor. Hold still and breathe normally while the cuff is inflated.  Getting a reading takes less than a minute.  Some models store readings in a memory. Some provide a printout of readings.  Get readings at different times of the day. You should wait at least 5 minutes between readings. Take readings with you to your next doctor's visit. Document Released: 12/18/2007 Document Revised: 03/29/2011 Document Reviewed: 12/18/2007 [facility] Patient Information 2014 [place]. As discussed I want you to only take your blood pressure 2 times a weel once on Monday morning and once on Thursday afternoon/evening and keep a record for your PCP

## 2013-01-23 NOTE — ED Provider Notes (Signed)
Medical screening examination/treatment/procedure(s) were performed by non-physician practitioner and as supervising physician I was immediately available for consultation/collaboration.  EKG Interpretation   None         [place] Nonaka F Abbygail [place], MD 01/23/13 2254 

## 2013-06-26 ENCOUNTER — Ambulatory Visit (HOSPITAL_COMMUNITY): Payer: Medicare Other | Attending: Cardiovascular Disease | Admitting: Cardiology

## 2013-06-26 DIAGNOSIS — I4891 Unspecified atrial fibrillation: Secondary | ICD-10-CM | POA: Insufficient documentation

## 2013-06-26 DIAGNOSIS — E669 Obesity, unspecified: Secondary | ICD-10-CM | POA: Insufficient documentation

## 2013-06-26 DIAGNOSIS — Z87891 Personal history of nicotine dependence: Secondary | ICD-10-CM | POA: Insufficient documentation

## 2013-06-26 DIAGNOSIS — I77810 Thoracic aortic ectasia: Secondary | ICD-10-CM

## 2013-06-26 DIAGNOSIS — I519 Heart disease, unspecified: Secondary | ICD-10-CM

## 2013-06-26 NOTE — Progress Notes (Signed)
Limited echo performed. 

## 2013-06-28 ENCOUNTER — Other Ambulatory Visit: Payer: Self-pay | Admitting: Cardiology

## 2013-06-28 DIAGNOSIS — I77810 Thoracic aortic ectasia: Secondary | ICD-10-CM

## 2013-07-19 ENCOUNTER — Other Ambulatory Visit: Payer: Self-pay

## 2013-07-19 ENCOUNTER — Ambulatory Visit (HOSPITAL_COMMUNITY): Payer: Medicare Other | Attending: Cardiology | Admitting: Radiology

## 2013-07-19 ENCOUNTER — Other Ambulatory Visit (HOSPITAL_COMMUNITY): Payer: Self-pay | Admitting: Radiology

## 2013-07-19 DIAGNOSIS — I482 Chronic atrial fibrillation, unspecified: Secondary | ICD-10-CM

## 2013-07-19 DIAGNOSIS — I4891 Unspecified atrial fibrillation: Secondary | ICD-10-CM

## 2013-07-19 DIAGNOSIS — I77810 Thoracic aortic ectasia: Secondary | ICD-10-CM

## 2013-07-19 NOTE — Progress Notes (Signed)
Echocardiogram performed. No charge/ [facility] Partners[facility]anda Deal.

## 2013-07-23 ENCOUNTER — Other Ambulatory Visit: Payer: Self-pay | Admitting: General Surgery

## 2013-07-23 MED ORDER — RIVAROXABAN 20 MG PO TABS: 20.0000 mg | ORAL_TABLET | Freq: Every day | ORAL | Status: DC

## 2013-07-23 NOTE — Telephone Encounter (Signed)
RX sent in for pt  

## 2013-08-16 ENCOUNTER — Encounter: Payer: Self-pay | Admitting: Cardiology

## 2013-08-16 ENCOUNTER — Ambulatory Visit (INDEPENDENT_AMBULATORY_CARE_PROVIDER_SITE_OTHER): Payer: Medicare Other | Admitting: Cardiology

## 2013-08-16 ENCOUNTER — Encounter: Payer: Self-pay | Admitting: General Surgery

## 2013-08-16 VITALS — BP 132/82 | HR 58 | Ht 64.0 in | Wt 185.0 lb

## 2013-08-16 DIAGNOSIS — I4891 Unspecified atrial fibrillation: Secondary | ICD-10-CM

## 2013-08-16 DIAGNOSIS — I77810 Thoracic aortic ectasia: Secondary | ICD-10-CM

## 2013-08-16 DIAGNOSIS — I10 Essential (primary) hypertension: Secondary | ICD-10-CM

## 2013-08-16 LAB — BASIC METABOLIC PANEL
BUN: 13 mg/dL (ref 6–23)
CALCIUM: 9.2 mg/dL (ref 8.4–10.5)
CO2: 28 mEq/L (ref 19–32)
CREATININE: 0.8 mg/dL (ref 0.4–1.2)
Chloride: 103 mEq/L (ref 96–112)
GFR: 74.04 mL/min (ref >60.00)
GLUCOSE: 93 mg/dL (ref 70–99)
Potassium: 3.7 mEq/L (ref 3.5–5.1)
Sodium: 137 mEq/L (ref 135–145)

## 2013-08-16 NOTE — Patient Instructions (Signed)
Your physician recommends that you continue on your current medications as directed. Please refer to the Current Medication list given to you today.  Your physician recommends that you go to the lab today for a BMET  Your physician wants you to follow-up in: 6 months with Dr Turner You will receive a reminder letter in the mail two months in advance. If you don't receive a letter, please call our office to schedule the follow-up appointment.  

## 2013-08-16 NOTE — Progress Notes (Signed)
[address] [place], [place]  [address] Phone: [phone] Fax:  [phone]  Date:  08/16/2013   ID:  Sherilyn CooterBeulah D Lunsford, DOB 11/12/1940, MRN [id number]  PCP:  Katy ApoPOLITE,RONALD D, MD  Cardiologist:  Armanda Magicraci Graiden Henes, MD     History of Present Illness: Teresa White is a 73 y.o. female with a history of atrial fibrillation and HTN who presents today for followup. She is doing well.  She denies any chest pain, SOB, DOE, LE edema, dizziness, palpitations or syncope.     Wt Readings from Last 3 Encounters:  08/16/13 185 lb (83.915 kg)  01/22/13 168 lb (76.204 kg)  12/29/12 172 lb (78.019 kg)     Past Medical History  Diagnosis Date  . Hypertension   . Hypercholesteremia   . Depression   . Anxiety   . Overweight   . Back pain     history of abnormal MRI mild spinal stenosis at L3-4 and L4-5 small central disc herniation at L2-3 small L sided disc and Herniation at L1  . H/O echocardiogram 03/13    Normal EF and normal Stress test  . Atrial fibrillation   . PVC (premature ventricular contraction)   . Dilated aortic root   . Diastolic dysfunction     Current Outpatient Prescriptions  Medication Sig Dispense Refill  . acetaminophen (TYLENOL) 325 MG tablet Take 325 mg by mouth 2 (two) times daily. For pain      . acetaminophen (TYLENOL) 500 MG tablet Take 500 mg by mouth every 6 (six) hours as needed for mild pain. daily      . ALPRAZolam (XANAX) 0.25 MG tablet Take 0.125 mg by mouth at bedtime.       . hydrochlorothiazide (MICROZIDE) 12.5 MG capsule Take 12.5 mg by mouth every morning.      . metoprolol (LOPRESSOR) 50 MG tablet Take 50 mg by mouth 2 (two) times daily.      . rivaroxaban (XARELTO) 20 MG TABS tablet Take 1 tablet (20 mg total) by mouth daily.  90 tablet  1  . simethicone (MYLICON) 125 MG chewable tablet Chew 125 mg by mouth daily as needed for flatulence. For gas      . simvastatin (ZOCOR) 20 MG tablet Take 20 mg by mouth every evening.      .  sodium chloride (EQ SALINE NASAL SPRAY) 0.65 % nasal spray Place 1 spray into the nose 2 (two) times daily. Call if blood tinged nasal drainage does not resolve       No current facility-administered medications for this visit.    Allergies:    Allergies  Allergen Reactions  . Azithromycin Other (See Comments)    Causes AFIB  . Demerol Nausea And Vomiting  . Erythromycin Other (See Comments)    "causes A-Fib"  . Meperidine Nausea And Vomiting    Social History:  The patient  reports that she quit smoking about 15 years ago. Her smoking use included Cigarettes. She smoked 0.00 packs per day. She does not have any smokeless tobacco history on file. She reports that she does not drink alcohol or use illicit drugs.   Family History:  The patient's family history is not on file.   ROS:  Please see the history of present illness.      All other systems reviewed and negative.   PHYSICAL EXAM: VS:  BP 132/82  Pulse 58  Ht 5\' 4"  (1.626 m)  Wt 185 lb (83.915 kg)  BMI  31.74 kg/m2 Well nourished, well developed, in no acute distress HEENT: normal Neck: no JVD Cardiac:  normal S1, S2; RRR; no murmuroccasional ectopy Lungs:  clear to auscultation bilaterally, no wheezing, rhonchi or rales Abd: soft, nontender, no hepatomegaly Ext: no edema Skin: warm and dry Neuro:  CNs 2-12 intact, no focal abnormalities noted  EKG: NSR with PAC's and nonspecific ST abnormality  ASSESSMENT AND PLAN:  1. PAF maintaining NSR - continue Xarelto/metoprolol  2. HTN - well controlled - continue HCTZ and metoprolol  - check BMET 3. Chronic anticoagulation       4. Dilated aortic root - not dilated on last echo in June  Followup with me in 6 months     Signed, Armanda Magic, MD 08/16/2013 9:11 AM

## 2013-08-21 ENCOUNTER — Telehealth: Payer: Self-pay | Admitting: Cardiology

## 2013-08-21 NOTE — Telephone Encounter (Signed)
New message ° ° ° ° °Want lab results °

## 2013-08-21 NOTE — Telephone Encounter (Signed)
Pt is aware labs were stable and that letter with results is in mail.

## 2013-12-19 ENCOUNTER — Emergency Department (HOSPITAL_COMMUNITY): Payer: Medicare Other

## 2013-12-19 ENCOUNTER — Emergency Department (HOSPITAL_COMMUNITY)
Admission: EM | Admit: 2013-12-19 | Discharge: 2013-12-20 | Disposition: A | Discharge status: Home / Self Care | Payer: Medicare Other | Attending: Emergency Medicine | Admitting: Emergency Medicine

## 2013-12-19 ENCOUNTER — Encounter (HOSPITAL_COMMUNITY): Payer: Self-pay | Admitting: *Deleted

## 2013-12-19 DIAGNOSIS — I10 Essential (primary) hypertension: Secondary | ICD-10-CM | POA: Insufficient documentation

## 2013-12-19 DIAGNOSIS — M25562 Pain in left knee: Secondary | ICD-10-CM | POA: Insufficient documentation

## 2013-12-19 DIAGNOSIS — F329 Major depressive disorder, single episode, unspecified: Secondary | ICD-10-CM | POA: Insufficient documentation

## 2013-12-19 DIAGNOSIS — F419 Anxiety disorder, unspecified: Secondary | ICD-10-CM | POA: Insufficient documentation

## 2013-12-19 DIAGNOSIS — M25561 Pain in right knee: Secondary | ICD-10-CM | POA: Insufficient documentation

## 2013-12-19 DIAGNOSIS — E780 Pure hypercholesterolemia: Secondary | ICD-10-CM | POA: Diagnosis not present

## 2013-12-19 DIAGNOSIS — Z87891 Personal history of nicotine dependence: Secondary | ICD-10-CM | POA: Insufficient documentation

## 2013-12-19 DIAGNOSIS — I4891 Unspecified atrial fibrillation: Secondary | ICD-10-CM | POA: Insufficient documentation

## 2013-12-19 DIAGNOSIS — Z79899 Other long term (current) drug therapy: Secondary | ICD-10-CM | POA: Insufficient documentation

## 2013-12-19 NOTE — ED Provider Notes (Signed)
TIME SEEN: 11:28 PM  CHIEF COMPLAINT: Right knee pain  HPI: Patient is a 73 year old female with history of hypertension, hyperlipidemia, atrial fibrillation currently on Xarelto who presents to the emergency department with complaints of right knee pain. She states that her knee has been hurting for the past 2 days. Denies any known injury. She states today whenever she felt a pop in the posterior part of her right leg and noticed "blue veins" and swelling that was not present before. She was concerned that she may have a DVT. No prior history of DVT or PE. History of cancer. No prolonged immobilization, exogenous hormone use, tobacco use. No fever, redness or warmth. No history of injury. No numbness, tingling or focal weakness.  ROS: See HPI Constitutional: no fever  Eyes: no drainage  ENT: no runny nose   Cardiovascular:  no chest pain  Resp: no SOB  GI: no vomiting GU: no dysuria Integumentary: no rash  Allergy: no hives  Musculoskeletal: no leg swelling  Neurological: no slurred speech ROS otherwise negative  PAST MEDICAL HISTORY/PAST SURGICAL HISTORY:  Past Medical History  Diagnosis Date  . Hypertension   . Hypercholesteremia   . Depression   . Anxiety   . Overweight   . Back pain     history of abnormal MRI mild spinal stenosis at L3-4 and L4-5 small central disc herniation at L2-3 small L sided disc and Herniation at L1  . H/O echocardiogram 03/13    Normal EF and normal Stress test  . Atrial fibrillation   . PVC (premature ventricular contraction)   . Dilated aortic root   . Diastolic dysfunction     MEDICATIONS:  Prior to Admission medications   Medication Sig Start Date End Date Taking? Authorizing Provider  acetaminophen (TYLENOL) 325 MG tablet Take 325 mg by mouth 2 (two) times daily. For pain    Historical Provider, MD  acetaminophen (TYLENOL) 500 MG tablet Take 500 mg by mouth every 6 (six) hours as needed for mild pain. daily    Historical Provider, MD   ALPRAZolam (XANAX) 0.25 MG tablet Take 0.125 mg by mouth at bedtime.     Historical Provider, MD  hydrochlorothiazide (MICROZIDE) 12.5 MG capsule Take 12.5 mg by mouth every morning.    Historical Provider, MD  metoprolol (LOPRESSOR) 50 MG tablet Take 50 mg by mouth 2 (two) times daily.    Historical Provider, MD  rivaroxaban (XARELTO) 20 MG TABS tablet Take 1 tablet (20 mg total) by mouth daily. 07/23/13   Quintella Reichertraci R Turner, MD  simethicone (MYLICON) 125 MG chewable tablet Chew 125 mg by mouth daily as needed for flatulence. For gas    Historical Provider, MD  simvastatin (ZOCOR) 20 MG tablet Take 20 mg by mouth every evening.    Historical Provider, MD  sodium chloride (EQ SALINE NASAL SPRAY) 0.65 % nasal spray Place 1 spray into the nose 2 (two) times daily. Call if blood tinged nasal drainage does not resolve 12/29/12   Quintella Reichertraci R Turner, MD    ALLERGIES:  Allergies  Allergen Reactions  . Azithromycin Other (See Comments)    Causes AFIB  . Demerol Nausea And Vomiting  . Erythromycin Other (See Comments)    "causes A-Fib"  . Meperidine Nausea And Vomiting    SOCIAL HISTORY:  History  Substance Use Topics  . Smoking status: Former Smoker    Types: Cigarettes    Quit date: 01/18/1998  . Smokeless tobacco: Not on file  . Alcohol Use: No  FAMILY HISTORY: No family history on file.  EXAM: BP 132/73 mmHg  Pulse 79  Temp(Src) 97.8 F (36.6 C)  Resp 18 CONSTITUTIONAL: Alert and oriented and responds appropriately to questions. Well-appearing; well-nourished HEAD: Normocephalic EYES: Conjunctivae clear, PERRL ENT: normal nose; no rhinorrhea; moist mucous membranes; pharynx without lesions noted NECK: Supple, no meningismus, no LAD  CARD: RRR; S1 and S2 appreciated; no murmurs, no clicks, no rubs, no gallops RESP: Normal chest excursion without splinting or tachypnea; breath sounds clear and equal bilaterally; no wheezes, no rhonchi, no rales,  ABD/GI: Normal bowel sounds;  non-distended; soft, non-tender, no rebound, no guarding BACK:  The back appears normal and is non-tender to palpation, there is no CVA tenderness EXT: Patient is tender to palpation in the right popliteal fossa without obvious swelling, erythema or warmth, no obvious Baker cyst, slightly tender at the most superior portion of the posterior calf, no sign of superficial femoral phlebitis, no joint effusion, no ligamentous laxity, 2+ DP pulses bilaterally, sensation to light touch intact diffusely, no pain over the proximal fibular head or right hip or right ankle, Normal ROM in all joints; otherwise extremities are non-tender to palpation; no edema; normal capillary refill; no cyanosis    SKIN: Normal color for age and race; warm NEURO: Moves all extremities equally PSYCH: The patient's mood and manner are appropriate. Grooming and personal hygiene are appropriate.  MEDICAL DECISION MAKING: Patient here with right popliteal fossa pain. This could have been a ruptured Baker cyst. No sign of hamstring take air. No significant injury. Given her age however will order an x-ray to evaluate for possible pathologic fracture. Denies pain medication. Discussed with patient that she is ready on the treatment for DVT and I feel this is unlikely but will order an ultrasound to be performed tomorrow morning. She denies chest pain or shortness of breath. No sign of septic arthritis on exam. Neurovascular intact distally.  ED PROGRESS: Xray reviewed by me. There is no acute fracture or dislocation. We'll discharge home with an order for a venous ultrasound of her right lower extremity tomorrow. Given she is are you on Xarelto I do not feel she needs further anticoagulation at this time. Have discussed return precautions. We'll discharge with prescription for Vicodin to use as needed for pain.  Patient verbalizes understanding is comfortable with plan.     Layla MawKristen N Pamelia Botto, DO 12/20/13 [phone]

## 2013-12-19 NOTE — ED Notes (Signed)
The pt was cleaning today and she felt soemthiung pop in her rt lower leg just at the posterior knee.  She has bruisinhg that she noticed when she bathed.  She is on a blood thinner and she ius worried

## 2013-12-20 ENCOUNTER — Ambulatory Visit (HOSPITAL_COMMUNITY)
Admission: RE | Admit: 2013-12-20 | Discharge: 2013-12-20 | Disposition: A | Discharge status: Home / Self Care | Payer: Medicare Other | Source: Ambulatory Visit | Attending: Emergency Medicine | Admitting: Emergency Medicine

## 2013-12-20 DIAGNOSIS — S8010XA Contusion of unspecified lower leg, initial encounter: Secondary | ICD-10-CM | POA: Insufficient documentation

## 2013-12-20 DIAGNOSIS — M791 Myalgia: Secondary | ICD-10-CM | POA: Insufficient documentation

## 2013-12-20 DIAGNOSIS — M79609 Pain in unspecified limb: Secondary | ICD-10-CM

## 2013-12-20 MED ORDER — HYDROCODONE-ACETAMINOPHEN 5-325 MG PO TABS: 1.0000 | ORAL_TABLET | ORAL | Status: DC | PRN

## 2013-12-20 NOTE — Progress Notes (Signed)
*  PRELIMINARY RESULTS* Vascular Ultrasound Right lower extremity venous duplex has been completed.  Preliminary findings: no evidence of DVT. Right baker's cyst noted.  Farrel DemarkJill Eunice, RDMS, RVT  12/20/2013, 8:21 AM

## 2013-12-20 NOTE — Discharge Instructions (Signed)
IMPORTANT PATIENT INSTRUCTIONS:  You have been scheduled for an Outpatient Vascular Study at [facility].    If tomorrow is a Saturday or Sunday, please go to the [facility] Emergency Department Registration Desk at 8:30 am tomorrow morning and tell them you are there for a vascular study.  If tomorrow is a weekday (Monday-Friday), please go to [facility] Admitting Department at 8:30 am and tell them you  are  there for a vascular study.    Knee Pain The knee is the complex joint between your thigh and your lower leg. It is made up of bones, tendons, ligaments, and cartilage. The bones that make up the knee are:  The femur in the thigh.  The tibia and fibula in the lower leg.  The patella or kneecap riding in the groove on the lower femur. CAUSES  Knee pain is a common complaint with many causes. A few of these causes are:  Injury, such as:  A ruptured ligament or tendon injury.  Torn cartilage.  Medical conditions, such as:  Gout  Arthritis  Infections  Overuse, over training, or overdoing a physical activity. Knee pain can be minor or severe. Knee pain can accompany debilitating injury. Minor knee problems often respond well to self-care measures or get well on their own. More serious injuries may need medical intervention or even surgery. SYMPTOMS The knee is complex. Symptoms of knee problems can vary widely. Some of the problems are:  Pain with movement and weight bearing.  Swelling and tenderness.  Buckling of the knee.  Inability to straighten or extend your knee.  Your knee locks and you cannot straighten it.  Warmth and redness with pain and fever.  Deformity or dislocation of the kneecap. DIAGNOSIS  Determining what is wrong may be very straight forward such as when there is an injury. It can also be challenging because of the complexity of the knee. Tests to make a diagnosis may include:  Your caregiver taking a history and doing a physical  exam.  Routine X-rays can be used to rule out other problems. X-rays will not reveal a cartilage tear. Some injuries of the knee can be diagnosed by:  Arthroscopy a surgical technique by which a small video camera is inserted through tiny incisions on the sides of the knee. This procedure is used to examine and repair internal knee joint problems. Tiny instruments can be used during arthroscopy to repair the torn knee cartilage (meniscus).  Arthrography is a radiology technique. A contrast liquid is directly injected into the knee joint. Internal structures of the knee joint then become visible on X-ray film.  An MRI scan is a non X-ray radiology procedure in which magnetic fields and a computer produce two- or three-dimensional images of the inside of the knee. Cartilage tears are often visible using an MRI scanner. MRI scans have largely replaced arthrography in diagnosing cartilage tears of the knee.  Blood work.  Examination of the fluid that helps to lubricate the knee joint (synovial fluid). This is done by taking a sample out using a needle and a syringe. TREATMENT The treatment of knee problems depends on the cause. Some of these treatments are:  Depending on the injury, proper casting, splinting, surgery, or physical therapy care will be needed.  Give yourself adequate recovery time. Do not overuse your joints. If you begin to get sore during workout routines, back off. Slow down or do fewer repetitions.  For repetitive activities such as cycling or running,  maintain your strength and nutrition.  Alternate muscle groups. For example, if you are a weight lifter, work the upper body on one day and the lower body the next.  Either tight or weak muscles do not give the proper support for your knee. Tight or weak muscles do not absorb the stress placed on the knee joint. Keep the muscles surrounding the knee strong.  Take care of mechanical problems.  If you have flat feet, orthotics  or special shoes may help. See your caregiver if you need help.  Arch supports, sometimes with wedges on the inner or outer aspect of the heel, can help. These can shift pressure away from the side of the knee most bothered by osteoarthritis.  A brace called an "unloader" brace also may be used to help ease the pressure on the most arthritic side of the knee.  If your caregiver has prescribed crutches, braces, wraps or ice, use as directed. The acronym for this is PRICE. This means protection, rest, ice, compression, and elevation.  Nonsteroidal anti-inflammatory drugs (NSAIDs), can help relieve pain. But if taken immediately after an injury, they may actually increase swelling. Take NSAIDs with food in your stomach. Stop them if you develop stomach problems. Do not take these if you have a history of ulcers, stomach pain, or bleeding from the bowel. Do not take without your caregiver's approval if you have problems with fluid retention, heart failure, or kidney problems.  For ongoing knee problems, physical therapy may be helpful.  Glucosamine and chondroitin are over-the-counter dietary supplements. Both may help relieve the pain of osteoarthritis in the knee. These medicines are different from the usual anti-inflammatory drugs. Glucosamine may decrease the rate of cartilage destruction.  Injections of a corticosteroid drug into your knee joint may help reduce the symptoms of an arthritis flare-up. They may provide pain relief that lasts a few months. You may have to wait a few months between injections. The injections do have a small increased risk of infection, water retention, and elevated blood sugar levels.  Hyaluronic acid injected into damaged joints may ease pain and provide lubrication. These injections may work by reducing inflammation. A series of shots may give relief for as long as 6 months.  Topical painkillers. Applying certain ointments to your skin may help relieve the pain and  stiffness of osteoarthritis. Ask your pharmacist for suggestions. Many over the-counter products are approved for temporary relief of arthritis pain.  In some countries, doctors often prescribe topical NSAIDs for relief of chronic conditions such as arthritis and tendinitis. A review of treatment with NSAID creams found that they worked as well as oral medications but without the serious side effects. PREVENTION  Maintain a healthy weight. Extra pounds put more strain on your joints.  Get strong, stay limber. Weak muscles are a common cause of knee injuries. Stretching is important. Include flexibility exercises in your workouts.  Be smart about exercise. If you have osteoarthritis, chronic knee pain or recurring injuries, you may need to change the way you exercise. This does not mean you have to stop being active. If your knees ache after jogging or playing basketball, consider switching to swimming, water aerobics, or other low-impact activities, at least for a few days a week. Sometimes limiting high-impact activities will provide relief.  Make sure your shoes fit well. Choose footwear that is right for your sport.  Protect your knees. Use the proper gear for knee-sensitive activities. Use kneepads when playing volleyball or laying carpet. Buckle  your seat belt every time you drive. Most shattered kneecaps occur in car accidents.  Rest when you are tired. SEEK MEDICAL CARE IF:  You have knee pain that is continual and does not seem to be getting better.  SEEK IMMEDIATE MEDICAL CARE IF:  Your knee joint feels hot to the touch and you have a high fever. MAKE SURE YOU:   Understand these instructions.  Will watch your condition.  Will get help right away if you are not doing well or get worse. Document Released: 11/01/2006 Document Revised: 03/29/2011 Document Reviewed: 11/01/2006 [facility] Patient Information 2015 [employer], [place]. This information is not intended to replace advice given  to you by your health care provider. Make sure you discuss any questions you have with your health care provider.   RICE: Routine Care for Injuries The routine care of many injuries includes Rest, Ice, Compression, and Elevation (RICE). HOME CARE INSTRUCTIONS  Rest is needed to allow your body to heal. Routine activities can usually be resumed when comfortable. Injured tendons and bones can take up to 6 weeks to heal. Tendons are the cord-like structures that attach muscle to bone.  Ice following an injury helps keep the swelling down and reduces pain.  Put ice in a plastic bag.  Place a towel between your skin and the bag.  Leave the ice on for 15-20 minutes, 3-4 times a day, or as directed by your health care provider. Do this while awake, for the first 24 to 48 hours. After that, continue as directed by your caregiver.  Compression helps keep swelling down. It also gives support and helps with discomfort. If an elastic bandage has been applied, it should be removed and reapplied every 3 to 4 hours. It should not be applied tightly, but firmly enough to keep swelling down. Watch fingers or toes for swelling, bluish discoloration, coldness, numbness, or excessive pain. If any of these problems occur, remove the bandage and reapply loosely. Contact your caregiver if these problems continue.  Elevation helps reduce swelling and decreases pain. With extremities, such as the arms, hands, legs, and feet, the injured area should be placed near or above the level of the heart, if possible. SEEK IMMEDIATE MEDICAL CARE IF:  You have persistent pain and swelling.  You develop redness, numbness, or unexpected weakness.  Your symptoms are getting worse rather than improving after several days. These symptoms may indicate that further evaluation or further X-rays are needed. Sometimes, X-rays may not show a small broken bone (fracture) until 1 week or 10 days later. Make a follow-up appointment with  your caregiver. Ask when your X-ray results will be ready. Make sure you get your X-ray results. Document Released: 04/18/2000 Document Revised: 01/09/2013 Document Reviewed: 06/05/2010 [facility]ExitCare Patient Information 2015 [place], [place]. This information is not intended to replace advice given to you by your health care provider. Make sure you discuss any questions you have with your health care provider.

## 2014-01-13 ENCOUNTER — Other Ambulatory Visit: Payer: Self-pay | Admitting: Cardiology

## 2014-02-12 ENCOUNTER — Encounter: Payer: Self-pay | Admitting: Cardiology

## 2014-02-12 ENCOUNTER — Ambulatory Visit (INDEPENDENT_AMBULATORY_CARE_PROVIDER_SITE_OTHER): Payer: Medicare Other | Admitting: Cardiology

## 2014-02-12 VITALS — BP 130/84 | HR 62 | Ht 64.0 in | Wt 180.4 lb

## 2014-02-12 DIAGNOSIS — I10 Essential (primary) hypertension: Secondary | ICD-10-CM

## 2014-02-12 DIAGNOSIS — I480 Paroxysmal atrial fibrillation: Secondary | ICD-10-CM

## 2014-02-12 DIAGNOSIS — I77810 Thoracic aortic ectasia: Secondary | ICD-10-CM

## 2014-02-12 DIAGNOSIS — R079 Chest pain, unspecified: Secondary | ICD-10-CM

## 2014-02-12 LAB — CBC WITH DIFFERENTIAL/PLATELET
Basophils Absolute: 0.1 10*3/uL (ref 0.0–0.1)
Basophils Relative: 0.8 % (ref 0.0–3.0)
EOS ABS: 0.2 10*3/uL (ref 0.0–0.7)
Eosinophils Relative: 2.2 % (ref 0.0–5.0)
HCT: 39.9 % (ref 36.0–46.0)
HEMOGLOBIN: 13.6 g/dL (ref 12.0–15.0)
LYMPHS PCT: 31.3 % (ref 12.0–46.0)
Lymphs Abs: 3.1 10*3/uL (ref 0.7–4.0)
MCHC: 34 g/dL (ref 30.0–36.0)
MCV: 84.2 fl (ref 78.0–100.0)
MONOS PCT: 7.8 % (ref 3.0–12.0)
Monocytes Absolute: 0.8 10*3/uL (ref 0.1–1.0)
NEUTROS PCT: 57.9 % (ref 43.0–77.0)
Neutro Abs: 5.6 10*3/uL (ref 1.4–7.7)
PLATELETS: 342 10*3/uL (ref 150.0–400.0)
RBC: 4.74 Mil/uL (ref 3.87–5.11)
RDW: 13.9 % (ref 11.5–15.5)
WBC: 9.8 10*3/uL (ref 4.0–10.5)

## 2014-02-12 LAB — BASIC METABOLIC PANEL
BUN: 12 mg/dL (ref 6–23)
CALCIUM: 9.2 mg/dL (ref 8.4–10.5)
CHLORIDE: 104 meq/L (ref 96–112)
CO2: 27 mEq/L (ref 19–32)
CREATININE: 0.76 mg/dL (ref 0.40–1.20)
GFR: 79.58 mL/min (ref >60.00)
Glucose, Bld: 90 mg/dL (ref 70–99)
Potassium: 3.7 mEq/L (ref 3.5–5.1)
Sodium: 139 mEq/L (ref 135–145)

## 2014-02-12 NOTE — Addendum Note (Signed)
Addended by: Armanda MagicURNER, TRACI R on: 02/12/2014 02:30 PM   Modules accepted: Kipp BroodSmartSet

## 2014-02-12 NOTE — Addendum Note (Signed)
Addended by: Gunnar FusiKEMP, Donyae Kilner A on: 02/12/2014 11:04 AM   Modules accepted: Orders

## 2014-02-12 NOTE — Patient Instructions (Addendum)
Your physician recommends that you continue on your current medications as directed. Please refer to the Current Medication list given to you today.  Your physician recommends that you have lab work TODAY (CBC, BMET)  Your physician wants you to follow-up in: 6 months with Dr. Turner. You will receive a reminder letter in the mail two months in advance. If you don't receive a letter, please call our office to schedule the follow-up appointment. 

## 2014-02-12 NOTE — Addendum Note (Signed)
Addended by: Gunnar FusiKEMP, KATHRYN A on: 02/12/2014 10:48 AM   Modules accepted: Orders

## 2014-02-12 NOTE — Progress Notes (Addendum)
Cardiology Office Note   Date:  02/12/2014   ID:  Teresa White, DOB [birth year], MRN [id number]  PCP:  Katy Apo, MD  Cardiologist:   Quintella Reichert, MD   Chief Complaint  Patient presents with  . Atrial Fibrillation  . Hypertension      History of Present Illness: Teresa White is a 74 y.o. female with a history of atrial fibrillation and HTN who presents today for followup. She is doing well. She denies any chest pain, SOB, DOE, LE edema, dizziness  or syncope.  Occasionally she will feel her heart beat faster but thinks that it is due to anxiety and the fast heart beat resolves with xanax.    Past Medical History  Diagnosis Date  . Hypertension   . Hypercholesteremia   . Depression   . Anxiety   . Overweight   . Back pain     history of abnormal MRI mild spinal stenosis at L3-4 and L4-5 small central disc herniation at L2-3 small L sided disc and Herniation at L1  . H/O echocardiogram 03/13    Normal EF and normal Stress test  . Atrial fibrillation   . PVC (premature ventricular contraction)   . Dilated aortic root   . Diastolic dysfunction     Past Surgical History  Procedure Laterality Date  . Abdominal surgery       Current Outpatient Prescriptions  Medication Sig Dispense Refill  . ALPRAZolam (XANAX) 0.25 MG tablet Take 0.125 mg by mouth at bedtime.     . hydrochlorothiazide (MICROZIDE) 12.5 MG capsule Take 12.5 mg by mouth every morning.    . metoprolol (LOPRESSOR) 50 MG tablet Take 50 mg by mouth 2 (two) times daily.    . simethicone (MYLICON) 125 MG chewable tablet Chew 125 mg by mouth daily as needed for flatulence. For gas    . simvastatin (ZOCOR) 20 MG tablet Take 20 mg by mouth every evening.    . sodium chloride (EQ SALINE NASAL SPRAY) 0.65 % nasal spray Place 1 spray into the nose 2 (two) times daily. Call if blood tinged nasal drainage does not resolve    . XARELTO 20 MG TABS tablet TAKE 1 TABLET (20 MG TOTAL) BY MOUTH DAILY. 90  tablet 2  . acetaminophen (TYLENOL) 325 MG tablet Take 325 mg by mouth 2 (two) times daily. For pain     No current facility-administered medications for this visit.    Allergies:   Azithromycin; Demerol; Erythromycin; and Meperidine    Social History:  The patient  reports that she quit smoking about 16 years ago. Her smoking use included Cigarettes. She does not have any smokeless tobacco history on file. She reports that she does not drink alcohol or use illicit drugs.   Family History:  The patient's family history is not on file.    ROS:  Please see the history of present illness.   Otherwise, review of systems are positive for none.   All other systems are reviewed and negative.    PHYSICAL EXAM: VS:  BP 130/84 mmHg  Pulse 62  Ht  (1.626 m)  Wt 180 lb 6.4 oz (81.829 kg)  BMI 30.95 kg/m2 , BMI Body mass index is 30.95 kg/(m^2). GEN: Well nourished, well developed, in no acute distress HEENT: normal Neck: no JVD, carotid bruits, or masses Cardiac: RRR; no murmurs, rubs, or gallops,no edema  Respiratory:  clear to auscultation bilaterally, normal work of breathing GI: soft, nontender, nondistended, + BS  MS: no deformity or atrophy Skin: warm and dry, no rash Neuro:  Strength and sensation are intact Psych: euthymic mood, full affect   EKG:  EKG with PAC's and nonspecific ST abnormality    Recent Labs: 08/16/2013: BUN 13; Creatinine 0.8; Potassium 3.7; Sodium 137    Lipid Panel No results found for: CHOL, TRIG, HDL, CHOLHDL, VLDL, LDLCALC, LDLDIRECT    Wt Readings from Last 3 Encounters:  02/12/14 180 lb 6.4 oz (81.829 kg)  08/16/13 185 lb (83.915 kg)  01/22/13 168 lb (76.204 kg)      Other studies Reviewed: Additional studies/ records that were reviewed today include: none.   ASSESSMENT AND PLAN:  1. PAF maintaining NSR - continue Xarelto/metoprolol  - check NOAC panel 2. HTN - well controlled - continue HCTZ and metoprolol  - check BMET 3.   Chronic anticoagulation  4.   Dilated aortic root - not dilated on last echo in June    Current medicines are reviewed at length with the patient today.  The patient does not have concerns regarding medicines.  The following changes have been made:  no change  Labs/ tests ordered today include: BMET/CBC     Disposition:   FU with me in 6 months   Signed, Quintella ReichertURNER,Caetano Oberhaus R, MD  02/12/2014 10:07 AM    [facility] [address], [place], [place]  [address] Phone: [phone]; Fax: [phone]

## 2014-02-12 NOTE — Addendum Note (Signed)
Addended by: Armanda MagicURNER, Merrik Puebla R on: 02/12/2014 10:51 AM   Modules accepted: Kipp BroodSmartSet

## 2014-02-13 ENCOUNTER — Telehealth: Payer: Self-pay | Admitting: Cardiology

## 2014-02-13 ENCOUNTER — Telehealth: Payer: Self-pay

## 2014-02-13 DIAGNOSIS — I10 Essential (primary) hypertension: Secondary | ICD-10-CM

## 2014-02-13 MED ORDER — POTASSIUM CHLORIDE ER 10 MEQ PO TBCR: 10.0000 meq | EXTENDED_RELEASE_TABLET | Freq: Every day | ORAL | Status: DC

## 2014-02-13 NOTE — Telephone Encounter (Signed)
New Message        Pt calling stating she is returning phone call from [place]. Please call back and advise.

## 2014-02-13 NOTE — Telephone Encounter (Signed)
-----   Message from Quintella Reichertraci R Turner, MD sent at 02/12/2014  4:42 PM EST ----- Potassium on low side of normal - please add Kdur 10meq daily and recheck BMET in 1 week

## 2014-02-13 NOTE — Telephone Encounter (Signed)
Informed patient her Teresa White has already been called in.  Patient grateful for callback.

## 2014-02-13 NOTE — Telephone Encounter (Signed)
Patient informed of results and verbal understanding expressed.  Instructed patient to START Kdur 10 meq daily. BMET scheduled for next Wednesday. Patient agrees with treatment plan.

## 2014-02-14 ENCOUNTER — Telehealth: Payer: Self-pay | Admitting: Cardiology

## 2014-02-14 NOTE — Telephone Encounter (Signed)
Informed patient that she needs to continue her potassium and blood pressure medications, as her BP meds can made her potassium levels decline. Patient agrees with treatment plan and is thankful for the callback.

## 2014-02-14 NOTE — Telephone Encounter (Signed)
Pt c/o medication issue: 1. Name of Medication: Klorinon 2. How are you currently taking this medication (dosage and times per day)? 1x per day 3. Are you having a reaction (difficulty breathing--STAT)?  No 4. What is your medication issue? Pt calling stating it is for potassium and wants to know if it will interfere w/ bp meds or her diuretic

## 2014-02-20 ENCOUNTER — Other Ambulatory Visit (INDEPENDENT_AMBULATORY_CARE_PROVIDER_SITE_OTHER): Payer: Medicare Other | Admitting: *Deleted

## 2014-02-20 DIAGNOSIS — I10 Essential (primary) hypertension: Secondary | ICD-10-CM

## 2014-02-20 LAB — BASIC METABOLIC PANEL
BUN: 14 mg/dL (ref 6–23)
CO2: 26 mEq/L (ref 19–32)
Calcium: 9 mg/dL (ref 8.4–10.5)
Chloride: 106 mEq/L (ref 96–112)
Creatinine, Ser: 0.8 mg/dL (ref 0.40–1.20)
GFR: 75 mL/min (ref >60.00)
GLUCOSE: 103 mg/dL — AB (ref 70–99)
POTASSIUM: 3.9 meq/L (ref 3.5–5.1)
Sodium: 137 mEq/L (ref 135–145)

## 2014-02-21 ENCOUNTER — Encounter: Payer: Self-pay | Admitting: Cardiology

## 2014-02-21 NOTE — Telephone Encounter (Signed)
This encounter was created in error - please disregard.

## 2014-02-21 NOTE — Telephone Encounter (Signed)
New Message        Pt calling to get lab results from yesterday. Please call back and advise.

## 2014-03-18 ENCOUNTER — Telehealth: Payer: Self-pay | Admitting: Cardiology

## 2014-03-18 NOTE — Telephone Encounter (Signed)
Instructed patient to F/U with PCP if itching does not improve. Patient agrees with treatment plan.

## 2014-03-18 NOTE — Telephone Encounter (Signed)
Informed patient that potassium is probably not causing the itching, as she has always had potassium in her body. Instructed patient to apply lotion as the weather is causing dry skin. Informed patient that she is to continue her current medication regimen and labs will be checked as Dr. Mayford Knifeurner orders them.  Patient requests Dr. Mayford Knifeurner to know as FYI.

## 2014-03-18 NOTE — Telephone Encounter (Signed)
New message     Pt c/o medication issue:  1. Name of Medication: klor-con 2. How are you currently taking this medication (dosage and times per day)? 10mg  daily 3. Are you having a reaction (difficulty breathing--STAT)? Maybe----started itching within the last week  4. What is your medication issue?  Pt has been taking rx for 30 days----should she have another blood test to check her potassium?

## 2014-03-18 NOTE — Telephone Encounter (Signed)
followup with PCP if itching dose not improve with lotion

## 2014-05-09 ENCOUNTER — Encounter: Payer: Self-pay | Admitting: Cardiology

## 2014-08-12 NOTE — Progress Notes (Signed)
Cardiology Office Note   Date:  08/13/2014   ID:  CRYSTALLEE WERDEN, DOB February 27, 1940, MRN [id number]  PCP:  Katy Apo, MD    Chief Complaint  Patient presents with  . Follow-up    Paroxysmal atrial fib      History of Present Illness: Teresa White is a 74 y.o. female with a history of atrial fibrillation and HTN who presents today for followup. She is doing well. She denies any  SOB, DOE, LE edema, dizziness, palpitations or syncope.  She has ben having intermittent pinching pain up under her left breast.  It can occur at rest or with exertion.  When she gets it with exertion she will also feel diaphoretic and get nausea.       Past Medical History  Diagnosis Date  . Hypertension   . Hypercholesteremia   . Depression   . Anxiety   . Overweight   . Back pain     history of abnormal MRI mild spinal stenosis at L3-4 and L4-5 small central disc herniation at L2-3 small L sided disc and Herniation at L1  . H/O echocardiogram 03/13    Normal EF and normal Stress test  . Atrial fibrillation   . PVC (premature ventricular contraction)   . Dilated aortic root   . Diastolic dysfunction     Past Surgical History  Procedure Laterality Date  . Abdominal surgery       Current Outpatient Prescriptions  Medication Sig Dispense Refill  . acetaminophen (TYLENOL) 325 MG tablet Take 325 mg by mouth 2 (two) times daily. For pain    . ALPRAZolam (XANAX) 0.25 MG tablet Take 0.125 mg by mouth at bedtime.     . metoprolol (LOPRESSOR) 50 MG tablet Take 50 mg by mouth 2 (two) times daily.    . simethicone (MYLICON) 125 MG chewable tablet Chew 125 mg by mouth daily as needed for flatulence. For gas    . simvastatin (ZOCOR) 20 MG tablet Take 20 mg by mouth every evening.    . sodium chloride (EQ SALINE NASAL SPRAY) 0.65 % nasal spray Place 1 spray into the nose 2 (two) times daily. Call if blood tinged nasal drainage does not resolve    . traMADol (ULTRAM) 50  MG tablet Take 50 mg by mouth every 12 (twelve) hours as needed for moderate pain.   0  . XARELTO 20 MG TABS tablet TAKE 1 TABLET (20 MG TOTAL) BY MOUTH DAILY. 90 tablet 2  . hydrochlorothiazide (MICROZIDE) 12.5 MG capsule Take 12.5 mg by mouth every morning. Pt not taking    . potassium chloride (K-DUR) 10 MEQ tablet Take 1 tablet (10 mEq total) by mouth daily. (Patient not taking: Reported on 08/13/2014) 90 tablet 3   No current facility-administered medications for this visit.    Allergies:   Azithromycin; Demerol; Erythromycin; and Meperidine    Social History:  The patient  reports that she quit smoking about 16 years ago. Her smoking use included Cigarettes. She does not have any smokeless tobacco history on file. She reports that she does not drink alcohol or use illicit drugs.   Family History:  The patient's family history includes Diabetes in her brother and sister; Kidney disease in her mother; Other in her father.    ROS:  Please see the history of present illness.   Otherwise, review of systems are positive for none.  All other systems are reviewed and negative.    PHYSICAL EXAM: VS:  BP 128/79 mmHg  Pulse 62  Ht 5\' 4"  (1.626 m)  Wt 180 lb 6.4 oz (81.829 kg)  BMI 30.95 kg/m2  SpO2 91% , BMI Body mass index is 30.95 kg/(m^2). GEN: Well nourished, well developed, in no acute distress HEENT: normal Neck: no JVD, carotid bruits, or masses Cardiac: RRR; no murmurs, rubs, or gallops,no edema  Respiratory:  clear to auscultation bilaterally, normal work of breathing GI: soft, nontender, nondistended, + BS MS: no deformity or atrophy Skin: warm and dry, no rash Neuro:  Strength and sensation are intact Psych: euthymic mood, full affect   EKG:  EKG was ordered today and showed NSR with PAC's and nonspecific ST abnormality    Recent Labs: 02/12/2014: Hemoglobin 13.6; Platelets 342.0 02/20/2014: BUN 14; Creatinine, Ser 0.80; Potassium 3.9; Sodium 137    Lipid Panel No  results found for: CHOL, TRIG, HDL, CHOLHDL, VLDL, LDLCALC, LDLDIRECT    Wt Readings from Last 3 Encounters:  08/13/14 180 lb 6.4 oz (81.829 kg)  02/12/14 180 lb 6.4 oz (81.829 kg)  08/16/13 185 lb (83.915 kg)    ASSESSMENT AND PLAN:  1. PAF maintaining NSR - continue Xarelto/metoprolol  - check NOAC panel 2. HTN - well controlled - continue HCTZ and metoprolol  - check BMET 3. Chronic anticoagulation  4. Dilated aortic root - not dilated on last echo        5.   CP with typical and atypical components.  It is nonexertional and exertional but does occur with nausea and diaphoresis.  I will get a Lexiscan myoview to rule out ischemia    Current medicines are reviewed at length with the patient today.  The patient does not have concerns regarding medicines.  The following changes have been made:  no change  Labs/ tests ordered today: See above Assessment and Plan No orders of the defined types were placed in this encounter.     Disposition:   FU with me in 1 year  Signed, Quintella Reichert, MD  08/13/2014 9:24 AM    [facility] [address] [place], [place], [place]  [address] Phone: [phone]; Fax: [phone]

## 2014-08-13 ENCOUNTER — Ambulatory Visit (INDEPENDENT_AMBULATORY_CARE_PROVIDER_SITE_OTHER): Payer: Medicare Other | Admitting: Cardiology

## 2014-08-13 ENCOUNTER — Encounter: Payer: Self-pay | Admitting: Cardiology

## 2014-08-13 VITALS — BP 128/79 | HR 62 | Ht 64.0 in | Wt 180.4 lb

## 2014-08-13 DIAGNOSIS — R079 Chest pain, unspecified: Secondary | ICD-10-CM

## 2014-08-13 DIAGNOSIS — I10 Essential (primary) hypertension: Secondary | ICD-10-CM | POA: Diagnosis not present

## 2014-08-13 DIAGNOSIS — I480 Paroxysmal atrial fibrillation: Secondary | ICD-10-CM

## 2014-08-13 LAB — CBC WITH DIFFERENTIAL/PLATELET
BASOS ABS: 0 10*3/uL (ref 0.0–0.1)
Basophils Relative: 0.5 % (ref 0.0–3.0)
EOS PCT: 2.5 % (ref 0.0–5.0)
Eosinophils Absolute: 0.2 10*3/uL (ref 0.0–0.7)
HEMATOCRIT: 40.8 % (ref 36.0–46.0)
HEMOGLOBIN: 13.6 g/dL (ref 12.0–15.0)
Lymphocytes Relative: 30.1 % (ref 12.0–46.0)
Lymphs Abs: 2.5 10*3/uL (ref 0.7–4.0)
MCHC: 33.5 g/dL (ref 30.0–36.0)
MCV: 86.2 fl (ref 78.0–100.0)
Monocytes Absolute: 0.6 10*3/uL (ref 0.1–1.0)
Monocytes Relative: 7.1 % (ref 3.0–12.0)
NEUTROS ABS: 4.9 10*3/uL (ref 1.4–7.7)
Neutrophils Relative %: 59.8 % (ref 43.0–77.0)
Platelets: 301 10*3/uL (ref 150.0–400.0)
RBC: 4.73 Mil/uL (ref 3.87–5.11)
RDW: 14.1 % (ref 11.5–15.5)
WBC: 8.2 10*3/uL (ref 4.0–10.5)

## 2014-08-13 LAB — BASIC METABOLIC PANEL
BUN: 11 mg/dL (ref 6–23)
CALCIUM: 9.5 mg/dL (ref 8.4–10.5)
CHLORIDE: 104 meq/L (ref 96–112)
CO2: 25 mEq/L (ref 19–32)
Creatinine, Ser: 0.7 mg/dL (ref 0.40–1.20)
GFR: 87.38 mL/min (ref >60.00)
Glucose, Bld: 97 mg/dL (ref 70–99)
POTASSIUM: 4 meq/L (ref 3.5–5.1)
Sodium: 139 mEq/L (ref 135–145)

## 2014-08-13 NOTE — Patient Instructions (Addendum)
Medication Instructions:  Your physician recommends that you continue on your current medications as directed. Please refer to the Current Medication list given to you today.   Labwork: TODAY: BMET, CBC  Testing/Procedures: Your physician has requested that you have a lexiscan myoview. For further information please visit [email or web address] Please follow instruction sheet, as given.  Follow-Up: Your physician wants you to follow-up in: 6 months with Dr. Mayford Knife. You will receive a reminder letter in the mail two months in advance. If you don't receive a letter, please call our office to schedule the follow-up appointment.   Any Other Special Instructions Will Be Listed Below (If Applicable).

## 2014-08-26 ENCOUNTER — Ambulatory Visit (HOSPITAL_COMMUNITY): Payer: Medicare Other | Attending: Cardiology

## 2014-08-26 VITALS — Ht 64.0 in | Wt 180.0 lb

## 2014-08-26 DIAGNOSIS — R079 Chest pain, unspecified: Secondary | ICD-10-CM | POA: Diagnosis not present

## 2014-08-26 DIAGNOSIS — R51 Headache: Secondary | ICD-10-CM

## 2014-08-26 DIAGNOSIS — R519 Headache, unspecified: Secondary | ICD-10-CM

## 2014-08-26 LAB — MYOCARDIAL PERFUSION IMAGING
CHL CUP RESTING HR STRESS: 64 {beats}/min
CSEPPHR: 89 {beats}/min
LHR: 0.31
NUC STRESS TID: 1.01
SDS: 5
SRS: 2
SSS: 6

## 2014-08-26 MED ORDER — AMINOPHYLLINE 25 MG/ML IV SOLN
75.0000 mg | Freq: Once | INTRAVENOUS | Status: AC
  Administered 2014-08-26: 75 mg via INTRAVENOUS

## 2014-08-26 MED ORDER — TECHNETIUM TC 99M SESTAMIBI GENERIC - CARDIOLITE
31.4000 | Freq: Once | INTRAVENOUS | Status: AC | PRN
  Administered 2014-08-26: 31 via INTRAVENOUS

## 2014-08-26 MED ORDER — REGADENOSON 0.4 MG/5ML IV SOLN
0.4000 mg | Freq: Once | INTRAVENOUS | Status: AC
  Administered 2014-08-26: 0.4 mg via INTRAVENOUS

## 2014-08-26 MED ORDER — TECHNETIUM TC 99M SESTAMIBI GENERIC - CARDIOLITE
10.5000 | Freq: Once | INTRAVENOUS | Status: AC | PRN
  Administered 2014-08-26: 11 via INTRAVENOUS

## 2014-10-02 ENCOUNTER — Other Ambulatory Visit: Payer: Self-pay | Admitting: Cardiology

## 2014-11-21 ENCOUNTER — Encounter: Payer: Self-pay | Admitting: Cardiology

## 2015-03-13 ENCOUNTER — Ambulatory Visit: Payer: Medicare Other | Admitting: Cardiology

## 2015-03-16 NOTE — Progress Notes (Signed)
Cardiology Office Note   Date:  03/17/2015   ID:  ANAJULIA LEYENDECKER, DOB 12/16/1940, MRN [id number]  PCP:  Katy Apo, MD    Chief Complaint  Patient presents with  . Atrial Fibrillation  . Hypertension      History of Present Illness: Teresa White is a 75 y.o. female with a history of atrial fibrillation and HTN who presents today for followup.  At last OV she was complaining of vague CP and nuclear stress test showed no ischemia.  This still occurs and she describes it as a heaviness at times but nonexertional. She takes Gas X and it resolves. She denies any SOB, DOE, LE edema, dizziness, palpitations or syncope. She is frustrated that she has gained 6lbs.  She tries to walk but it is limited to shin splints.   Past Medical History  Diagnosis Date  . Hypertension   . Hypercholesteremia   . Depression   . Anxiety   . Overweight   . Back pain     history of abnormal MRI mild spinal stenosis at L3-4 and L4-5 small central disc herniation at L2-3 small L sided disc and Herniation at L1  . H/O echocardiogram 03/13    Normal EF and normal Stress test  . Atrial fibrillation (HCC)   . PVC (premature ventricular contraction)   . Dilated aortic root (HCC)   . Diastolic dysfunction     Past Surgical History  Procedure Laterality Date  . Abdominal surgery       Current Outpatient Prescriptions  Medication Sig Dispense Refill  . acetaminophen (TYLENOL) 325 MG tablet Take 325 mg by mouth 2 (two) times daily. For pain    . ALPRAZolam (XANAX) 0.25 MG tablet Take 0.125 mg by mouth at bedtime.     . metoprolol (LOPRESSOR) 50 MG tablet Take 50 mg by mouth 2 (two) times daily.    . simethicone (MYLICON) 125 MG chewable tablet Chew 125 mg by mouth daily as needed for flatulence. For gas    . simvastatin (ZOCOR) 20 MG tablet Take 20 mg by mouth every evening.    . sodium chloride (EQ SALINE NASAL SPRAY) 0.65 % nasal spray Place 1 spray into the nose 2  (two) times daily. Call if blood tinged nasal drainage does not resolve    . traMADol (ULTRAM) 50 MG tablet Take 50 mg by mouth every 12 (twelve) hours as needed for moderate pain.   0  . XARELTO 20 MG TABS tablet TAKE 1 TABLET (20 MG TOTAL) BY MOUTH DAILY. 90 tablet 1   No current facility-administered medications for this visit.    Allergies:   Azithromycin; Demerol; Erythromycin; and Meperidine    Social History:  The patient  reports that she quit smoking about 17 years ago. Her smoking use included Cigarettes. She has never used smokeless tobacco. She reports that she does not drink alcohol or use illicit drugs.   Family History:  The patient's family history includes Diabetes in her brother and sister; Kidney disease in her mother; Other in her father.    ROS:  Please see the history of present illness.   Otherwise, review of systems are positive for none.   All other systems are reviewed and negative.    PHYSICAL EXAM: VS:  BP 166/90 mmHg  Pulse 62  Ht  (1.626 m)  Wt 190 lb 9.6 oz (86.456 kg)  BMI 32.70 kg/m2  SpO2 97% , BMI Body mass index is 32.7 kg/(m^2). GEN: Well nourished, well developed, in no acute distress HEENT: normal Neck: no JVD, carotid bruits, or masses Cardiac: RRR; no murmurs, rubs, or gallops,no edema  Respiratory:  clear to auscultation bilaterally, normal work of breathing GI: soft, nontender, nondistended, + BS MS: no deformity or atrophy Skin: warm and dry, no rash Neuro:  Strength and sensation are intact Psych: euthymic mood, full affect   EKG:  EKG was ordered today showing NSR with PACs and nonspecific ST abnormality    Recent Labs: 08/13/2014: BUN 11; Creatinine, Ser 0.70; Hemoglobin 13.6; Platelets 301.0; Potassium 4.0; Sodium 139    Lipid Panel No results found for: CHOL, TRIG, HDL, CHOLHDL, VLDL, LDLCALC, LDLDIRECT    Wt Readings from Last 3 Encounters:  03/17/15 190 lb 9.6 oz (86.456 kg)  08/26/14 180 lb (81.647 kg)    08/13/14 180 lb 6.4 oz (81.829 kg)  ASSESSMENT AND PLAN:  1. PAF maintaining NSR - continue Xarelto/metoprolol  - check NOAC panel 2. HTN - borderline controlled - continue metoprolol  - I have asked her to check her BP daily for a week and call with the results. 3. Chronic anticoagulation on Xarelto  4. Dilated aortic root - not dilated on last echo    Current medicines are reviewed at length with the patient today.  The patient does not have concerns regarding medicines.  The following changes have been made:  no change  Labs/ tests ordered today: See above Assessment and Plan No orders of the defined types were placed in this encounter.     Disposition:   FU with me in 6 months  Signed, Quintella Reichert, MD  03/17/2015 8:34 AM    [facility] [address] [place], [place], [place]  [address] Phone: [phone]; Fax: [phone]

## 2015-03-17 ENCOUNTER — Encounter: Payer: Self-pay | Admitting: Cardiology

## 2015-03-17 ENCOUNTER — Ambulatory Visit (INDEPENDENT_AMBULATORY_CARE_PROVIDER_SITE_OTHER): Payer: Medicare Other | Admitting: Cardiology

## 2015-03-17 VITALS — BP 166/90 | HR 62 | Ht 64.0 in | Wt 190.6 lb

## 2015-03-17 DIAGNOSIS — I10 Essential (primary) hypertension: Secondary | ICD-10-CM | POA: Diagnosis not present

## 2015-03-17 DIAGNOSIS — I480 Paroxysmal atrial fibrillation: Secondary | ICD-10-CM | POA: Diagnosis not present

## 2015-03-17 LAB — CBC WITH DIFFERENTIAL/PLATELET
BASOS ABS: 0.1 10*3/uL (ref 0.0–0.1)
BASOS PCT: 1 % (ref 0–1)
Eosinophils Absolute: 0.3 10*3/uL (ref 0.0–0.7)
Eosinophils Relative: 4 % (ref 0–5)
HCT: 38.9 % (ref 36.0–46.0)
Hemoglobin: 13 g/dL (ref 12.0–15.0)
Lymphocytes Relative: 33 % (ref 12–46)
Lymphs Abs: 2.8 10*3/uL (ref 0.7–4.0)
MCH: 29.3 pg (ref 26.0–34.0)
MCHC: 33.4 g/dL (ref 30.0–36.0)
MCV: 87.6 fL (ref 78.0–100.0)
MONOS PCT: 9 % (ref 3–12)
MPV: 9.9 fL (ref 8.6–12.4)
Monocytes Absolute: 0.8 10*3/uL (ref 0.1–1.0)
NEUTROS PCT: 53 % (ref 43–77)
Neutro Abs: 4.5 10*3/uL (ref 1.7–7.7)
Platelets: 318 10*3/uL (ref 150–400)
RBC: 4.44 MIL/uL (ref 3.87–5.11)
RDW: 13.8 % (ref 11.5–15.5)
WBC: 8.4 10*3/uL (ref 4.0–10.5)

## 2015-03-17 LAB — BASIC METABOLIC PANEL
BUN: 13 mg/dL (ref 7–25)
CALCIUM: 8.9 mg/dL (ref 8.6–10.4)
CO2: 25 mmol/L (ref 20–31)
CREATININE: 0.74 mg/dL (ref 0.60–0.93)
Chloride: 105 mmol/L (ref 98–110)
Glucose, Bld: 90 mg/dL (ref 65–99)
Potassium: 3.9 mmol/L (ref 3.5–5.3)
Sodium: 139 mmol/L (ref 135–146)

## 2015-03-17 NOTE — Patient Instructions (Signed)
Medication Instructions:  Your physician recommends that you continue on your current medications as directed. Please refer to the Current Medication list given to you today.   Labwork: TODAY: CBC, BMET  Testing/Procedures: None  Follow-Up: Your physician wants you to follow-up in: 6 months with Dr. Mayford Knife. You will receive a reminder letter in the mail two months in advance. If you don't receive a letter, please call our office to schedule the follow-up appointment.   Any Other Special Instructions Will Be Listed Below (If Applicable). Please check your BLOOD PRESSURE daily for a week and call with results.    If you need a refill on your cardiac medications before your next appointment, please call your pharmacy.

## 2015-03-27 ENCOUNTER — Other Ambulatory Visit: Payer: Self-pay | Admitting: Cardiology

## 2015-06-08 IMAGING — DX DG KNEE COMPLETE 4+V*R*
4 series · 4 of 4 positions shown · non-contrast
Comparison: None.

CLINICAL DATA: Fall with acute right knee pain and swelling.
Initial encounter.

EXAM:
RIGHT KNEE - COMPLETE 4+ VIEW

[knee ap]
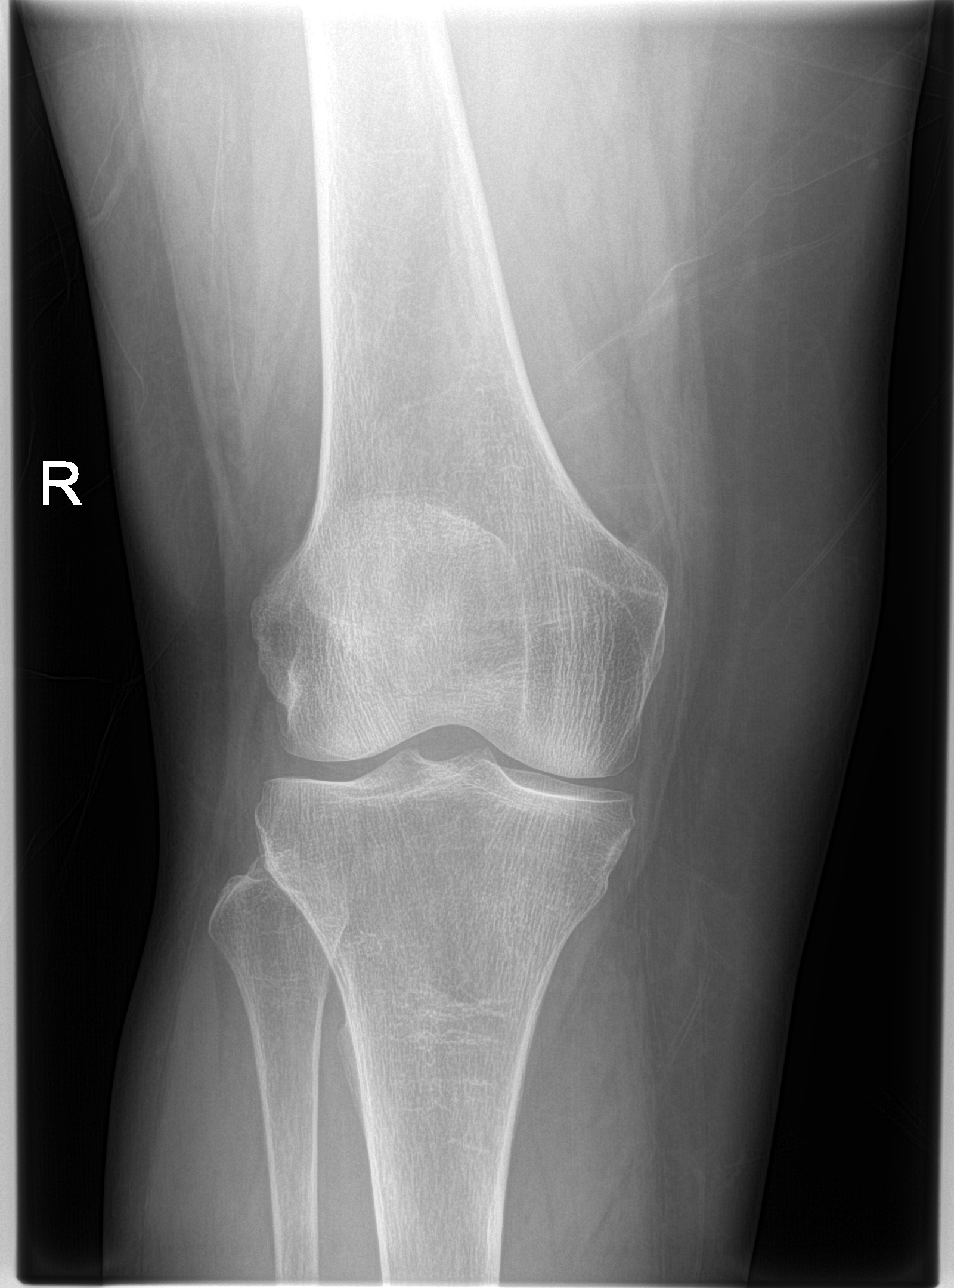

[knee obl (1 of 2)]
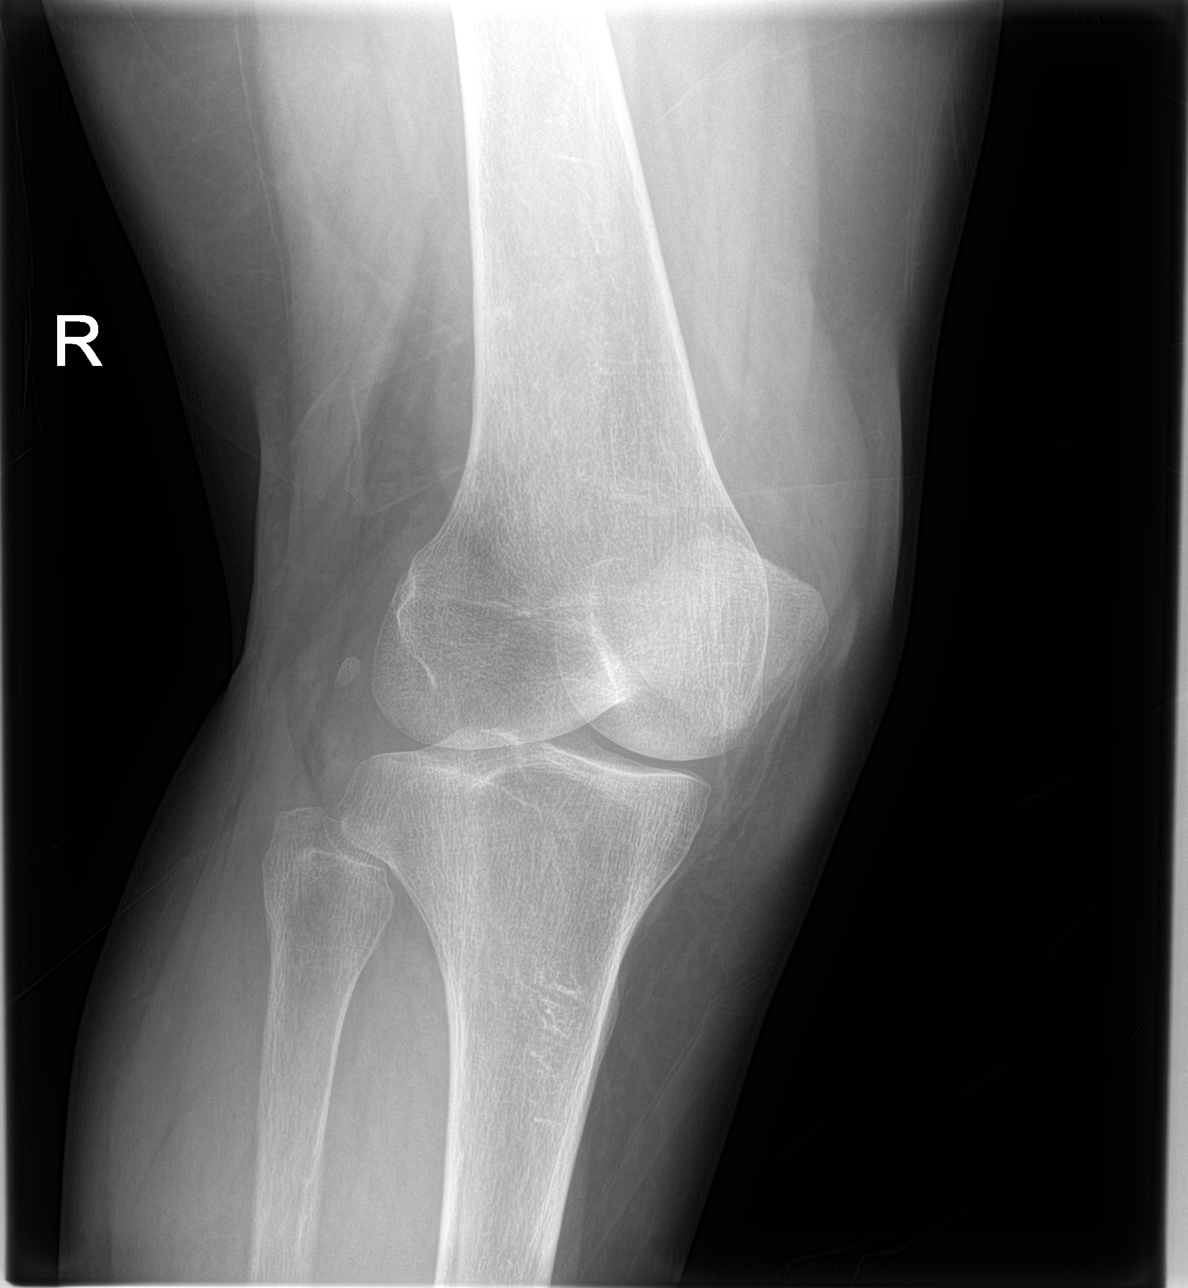

[knee obl (2 of 2)]
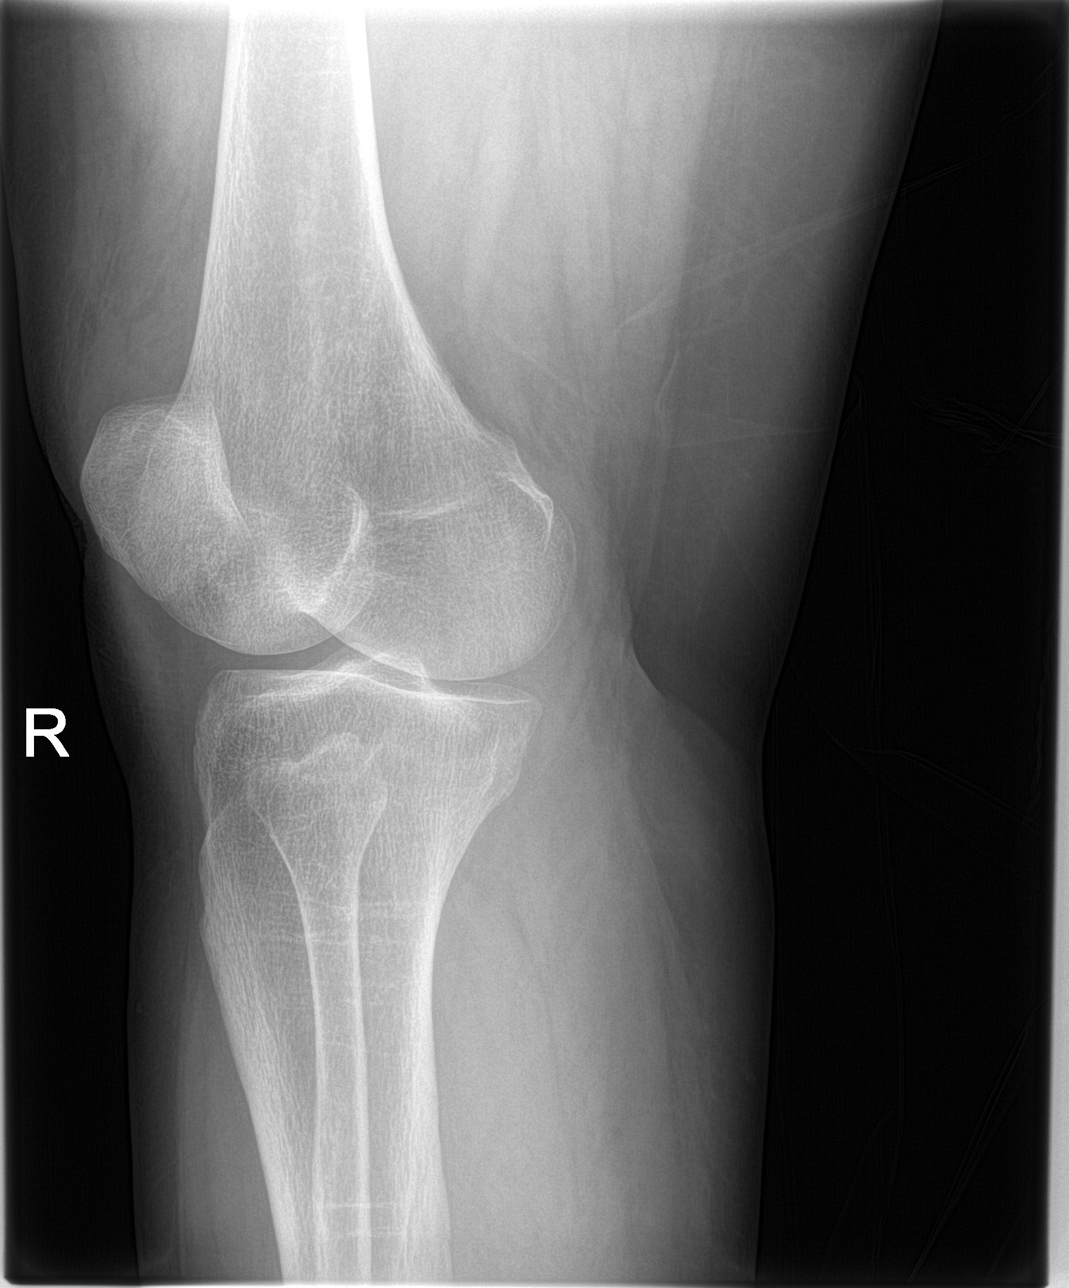

[knee lat]
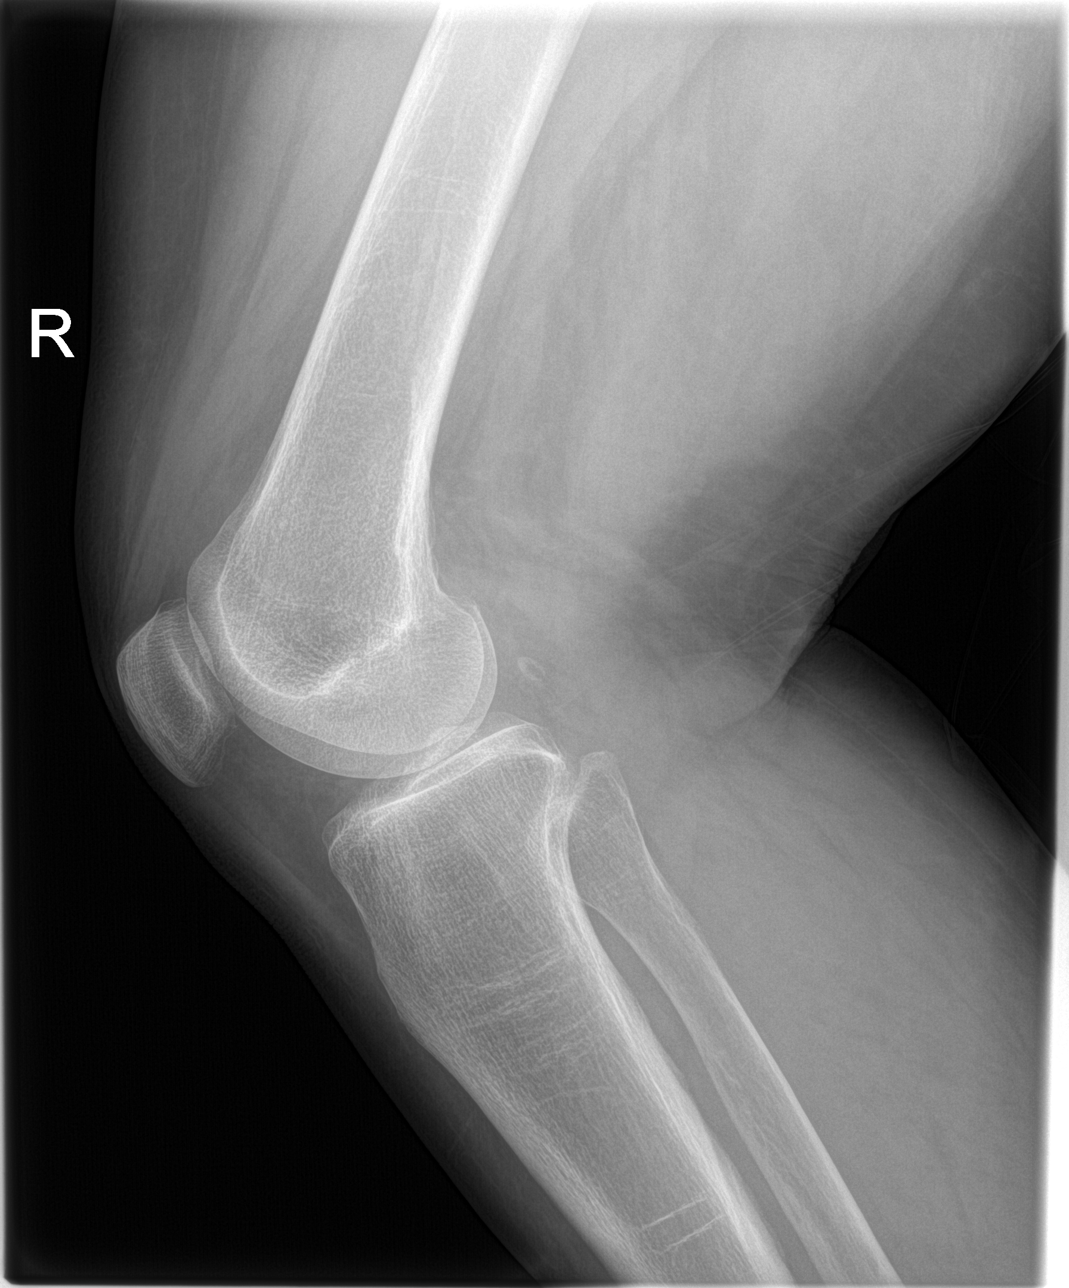

[4 of 4 positions shown; findings below may reference images not displayed]

FINDINGS: There is no evidence of fracture, dislocation, or joint effusion.
There is no evidence of arthropathy or other focal bone abnormality.
Soft tissues are unremarkable.
IMPRESSION: Negative.

## 2015-08-07 ENCOUNTER — Encounter: Payer: Self-pay | Admitting: Cardiology

## 2015-09-05 ENCOUNTER — Telehealth: Payer: Self-pay | Admitting: Cardiology

## 2015-09-05 NOTE — Telephone Encounter (Signed)
New message   Pt verbalized that she is calling because she wants the rn to call her back because she wants to know what dye did they put in her arm when she has the ECHO  I believe pt is confused i tried to assist her but she wants the rn to call her

## 2015-09-05 NOTE — Telephone Encounter (Signed)
Patient called to request information on what chemical is used for a nuclear stress test to view the heart. Informed the patient that the injection is a radioactive tracer brand-named "myoview." Confirmed with her it is not the same thing as a contrast used in procedures such as a cardiac catheterization or CT. She was grateful for assistance.

## 2015-09-18 ENCOUNTER — Ambulatory Visit: Payer: Medicare Other | Admitting: Cardiology

## 2015-10-03 ENCOUNTER — Encounter: Payer: Self-pay | Admitting: Cardiology

## 2015-10-16 ENCOUNTER — Ambulatory Visit (INDEPENDENT_AMBULATORY_CARE_PROVIDER_SITE_OTHER): Payer: Medicare Other | Admitting: Cardiology

## 2015-10-16 ENCOUNTER — Encounter: Payer: Self-pay | Admitting: Cardiology

## 2015-10-16 VITALS — BP 132/92 | HR 70 | Ht 64.0 in | Wt 186.0 lb

## 2015-10-16 DIAGNOSIS — E669 Obesity, unspecified: Secondary | ICD-10-CM

## 2015-10-16 DIAGNOSIS — I480 Paroxysmal atrial fibrillation: Secondary | ICD-10-CM

## 2015-10-16 DIAGNOSIS — I10 Essential (primary) hypertension: Secondary | ICD-10-CM

## 2015-10-16 HISTORY — DX: Obesity, unspecified: E66.9

## 2015-10-16 LAB — CBC WITH DIFFERENTIAL/PLATELET
BASOS PCT: 1 %
Basophils Absolute: 97 cells/uL (ref 0–200)
Eosinophils Absolute: 194 cells/uL (ref 15–500)
Eosinophils Relative: 2 %
HEMATOCRIT: 38.7 % (ref 35.0–45.0)
Hemoglobin: 12.9 g/dL (ref 11.7–15.5)
LYMPHS PCT: 32 %
Lymphs Abs: 3104 cells/uL (ref 850–3900)
MCH: 29.1 pg (ref 27.0–33.0)
MCHC: 33.3 g/dL (ref 32.0–36.0)
MCV: 87.4 fL (ref 80.0–100.0)
MONOS PCT: 7 %
MPV: 10 fL (ref 7.5–12.5)
Monocytes Absolute: 679 cells/uL (ref 200–950)
NEUTROS PCT: 58 %
Neutro Abs: 5626 cells/uL (ref 1500–7800)
PLATELETS: 352 10*3/uL (ref 140–400)
RBC: 4.43 MIL/uL (ref 3.80–5.10)
RDW: 13.2 % (ref 11.0–15.0)
WBC: 9.7 10*3/uL (ref 3.8–10.8)

## 2015-10-16 LAB — BASIC METABOLIC PANEL
BUN: 16 mg/dL (ref 7–25)
CHLORIDE: 106 mmol/L (ref 98–110)
CO2: 23 mmol/L (ref 20–31)
CREATININE: 0.67 mg/dL (ref 0.60–0.93)
Calcium: 8.8 mg/dL (ref 8.6–10.4)
Glucose, Bld: 96 mg/dL (ref 65–99)
POTASSIUM: 3.9 mmol/L (ref 3.5–5.3)
Sodium: 140 mmol/L (ref 135–146)

## 2015-10-16 NOTE — Patient Instructions (Signed)

## 2015-10-16 NOTE — Progress Notes (Signed)
Cardiology Office Note    Date:  10/16/2015   ID:  Teresa White, DOB [birth year], MRN [id number]  PCP:  Katy ApoPOLITE,RONALD D, MD  Cardiologist:  Armanda Magicraci Danica Camarena, MD   Chief Complaint  Patient presents with  . Atrial Fibrillation  . Hypertension    History of Present Illness:  Teresa White is a 75 y.o. female with a history of atrial fibrillation and HTN who presents today for followup.  She denies any chest pain or pressure, SOB, DOE, LE edema, dizziness, palpitations or syncope.She complains about chronic fatigue and inability to lose weight.  She had a nuclear stress test a year ago that showed no ischemia.     Past Medical History:  Diagnosis Date  . Anxiety   . Back pain    history of abnormal MRI mild spinal stenosis at L3-4 and L4-5 small central disc herniation at L2-3 small L sided disc and Herniation at L1  . Depression   . Diastolic dysfunction   . Dilated aortic root (HCC)   . H/O echocardiogram 03/13   Normal EF and normal Stress test  . Hypercholesteremia   . Hypertension   . Obesity (BMI 30-39.9) 10/16/2015  . Overweight   . PAF (paroxysmal atrial fibrillation) (HCC)   . PVC (premature ventricular contraction)     Past Surgical History:  Procedure Laterality Date  . ABDOMINAL SURGERY      Current Medications: Outpatient Medications Prior to Visit  Medication Sig Dispense Refill  . acetaminophen (TYLENOL) 325 MG tablet Take 325 mg by mouth 2 (two) times daily. For pain    . simethicone (MYLICON) 125 MG chewable tablet Chew 125 mg by mouth daily as needed for flatulence. For gas    . simvastatin (ZOCOR) 20 MG tablet Take 20 mg by mouth every evening.    . traMADol (ULTRAM) 50 MG tablet Take 50 mg by mouth every 12 (twelve) hours as needed for moderate pain.   0  . XARELTO 20 MG TABS tablet TAKE 1 TABLET (20 MG TOTAL) BY MOUTH DAILY. 90 tablet 3  . metoprolol (LOPRESSOR) 50 MG tablet Take 50 mg by mouth 2 (two) times daily.    Marland Kitchen. ALPRAZolam  (XANAX) 0.25 MG tablet Take 0.125 mg by mouth at bedtime.     . sodium chloride (EQ SALINE NASAL SPRAY) 0.65 % nasal spray Place 1 spray into the nose 2 (two) times daily. Call if blood tinged nasal drainage does not resolve     No facility-administered medications prior to visit.      Allergies:   Azithromycin; Demerol; Erythromycin; Flonase [fluticasone propionate]; and Meperidine   Social History   Social History  . Marital status: Single    Spouse name: N/A  . Number of children: N/A  . Years of education: N/A   Social History Main Topics  . Smoking status: Former Smoker    Types: Cigarettes    Quit date: 01/18/1998  . Smokeless tobacco: Never Used  . Alcohol use No  . Drug use: No  . Sexual activity: Not Asked   Other Topics Concern  . None   Social History Narrative  . None     Family History:  The patient's family history includes Diabetes in her brother and sister; Kidney disease in her mother; Other in her father.   ROS:   Please see the history of present illness.    ROS All other systems reviewed and are negative.  No flowsheet data found.  PHYSICAL EXAM:   VS:  BP (!) 132/92   Pulse 70   Ht 5\' 4"  (1.626 m)   Wt 186 lb (84.4 kg)   SpO2 96%   BMI 31.93 kg/m    GEN: Well nourished, well developed, in no acute distress  HEENT: normal  Neck: no JVD, carotid bruits, or masses Cardiac: RRR; no murmurs, rubs, or gallops,no edema.  Intact distal pulses bilaterally.  Respiratory:  clear to auscultation bilaterally, normal work of breathing GI: soft, nontender, nondistended, + BS MS: no deformity or atrophy  Skin: warm and dry, no rash Neuro:  Alert and Oriented x 3, Strength and sensation are intact Psych: euthymic mood, full affect  Wt Readings from Last 3 Encounters:  10/16/15 186 lb (84.4 kg)  03/17/15 190 lb 9.6 oz (86.5 kg)  08/26/14 180 lb (81.6 kg)      Studies/Labs Reviewed:   EKG:  EKG is not ordered today.  Recent Labs: 03/17/2015:  BUN 13; Creat 0.74; Hemoglobin 13.0; Platelets 318; Potassium 3.9; Sodium 139   Lipid Panel No results found for: CHOL, TRIG, HDL, CHOLHDL, VLDL, LDLCALC, LDLDIRECT  Additional studies/ records that were reviewed today include:  none    ASSESSMENT:    1. Paroxysmal atrial fibrillation (HCC)   2. Essential hypertension   3. Obesity (BMI 30-39.9)      PLAN:  In order of problems listed above:  1. PAF - maintaining NSR.  Continue BB and Xarelto.  Check BMET and CBC. 2. HTN - BP controlled on current meds.  Continue BB and diuretic.  BP goal < 150/35mmHg.  3. Obesity - I have encouraged him to get into a routine exercise program and cut back on carbs and portions.     Medication Adjustments/Labs and Tests Ordered: Current medicines are reviewed at length with the patient today.  Concerns regarding medicines are outlined above.  Medication changes, Labs and Tests ordered today are listed in the Patient Instructions below.  There are no Patient Instructions on file for this visit.   Signed, Armanda Magic, MD  10/16/2015 2:44 PM    [facility] [address] [place], [place], [place]  [address] Phone: [phone]; Fax: [phone]

## 2016-03-10 ENCOUNTER — Other Ambulatory Visit: Payer: Self-pay | Admitting: Cardiology

## 2016-04-13 ENCOUNTER — Ambulatory Visit (INDEPENDENT_AMBULATORY_CARE_PROVIDER_SITE_OTHER): Payer: Medicare Other | Admitting: Cardiology

## 2016-04-13 ENCOUNTER — Encounter (INDEPENDENT_AMBULATORY_CARE_PROVIDER_SITE_OTHER): Payer: Self-pay

## 2016-04-13 ENCOUNTER — Encounter: Payer: Self-pay | Admitting: Cardiology

## 2016-04-13 VITALS — BP 138/62 | HR 56 | Ht 64.0 in | Wt 187.8 lb

## 2016-04-13 DIAGNOSIS — I481 Persistent atrial fibrillation: Secondary | ICD-10-CM

## 2016-04-13 DIAGNOSIS — I10 Essential (primary) hypertension: Secondary | ICD-10-CM

## 2016-04-13 DIAGNOSIS — I4819 Other persistent atrial fibrillation: Secondary | ICD-10-CM

## 2016-04-13 NOTE — Progress Notes (Signed)
Cardiology Office Note    Date:  04/13/2016   ID:  Teresa White, DOB [birth year], MRN [id number]  PCP:  Katy Apo, MD  Cardiologist:  Armanda Magic, MD   Chief Complaint  Patient presents with  . Atrial Fibrillation  . Hypertension    History of Present Illness:  Teresa White is a 76 y.o. female  with a history of persistent atrial fibrillation and HTN who presents today for followup.  She denies any SOB, DOE, LE edema, dizziness, palpitations or syncope.She has had some problems with chest pressure in the mid sternal area which resolves with simethicone.  She denies any SOB, nausea or diaphoresis with the chest discomfort.  She had a nuclear stress test a year ago that showed no ischemia.  She is exercising some and runs in place 3 times daily.   Past Medical History:  Diagnosis Date  . Anxiety   . Back pain    history of abnormal MRI mild spinal stenosis at L3-4 and L4-5 small central disc herniation at L2-3 small L sided disc and Herniation at L1  . Depression   . Diastolic dysfunction   . Dilated aortic root (HCC)   . H/O echocardiogram 03/13   Normal EF and normal Stress test  . Hypercholesteremia   . Hypertension   . Obesity (BMI 30-39.9) 10/16/2015  . Overweight   . PAF (paroxysmal atrial fibrillation) (HCC)   . PVC (premature ventricular contraction)     Past Surgical History:  Procedure Laterality Date  . ABDOMINAL SURGERY      Current Medications: Current Meds  Medication Sig  . acetaminophen (TYLENOL) 325 MG tablet Take 325 mg by mouth 2 (two) times daily. For pain  . hydrochlorothiazide (HYDRODIURIL) 12.5 MG tablet Take 1 tablet by mouth every morning.  . metoprolol (LOPRESSOR) 100 MG tablet Take 100 mg by mouth 2 (two) times daily.  . simethicone (MYLICON) 125 MG chewable tablet Chew 125 mg by mouth daily as needed for flatulence. For gas  . simvastatin (ZOCOR) 20 MG tablet Take 20 mg by mouth every evening.  . traMADol (ULTRAM) 50  MG tablet Take 50 mg by mouth every 12 (twelve) hours as needed for moderate pain.   Marland Kitchen XARELTO 20 MG TABS tablet TAKE 1 TABLET (20 MG TOTAL) BY MOUTH DAILY.    Allergies:   Azithromycin; Demerol; Erythromycin; Flonase [fluticasone propionate]; Meperidine; and Vicodin [hydrocodone-acetaminophen]   Social History   Social History  . Marital status: Single    Spouse name: N/A  . Number of children: N/A  . Years of education: N/A   Social History Main Topics  . Smoking status: Former Smoker    Types: Cigarettes    Quit date: 01/18/1998  . Smokeless tobacco: Never Used  . Alcohol use No  . Drug use: No  . Sexual activity: Not Asked   Other Topics Concern  . None   Social History Narrative  . None     Family History:  The patient's family history includes Diabetes in her brother and sister; Kidney disease in her mother; Other in her father.   ROS:   Please see the history of present illness.    ROS All other systems reviewed and are negative.  No flowsheet data found.     PHYSICAL EXAM:   VS:  BP 138/62   Pulse (!) 56   Ht 5\' 4"  (1.626 m)   Wt 187 lb 12.8 oz (85.2 kg)   BMI 32.24 kg/m  GEN: Well nourished, well developed, in no acute distress  HEENT: normal  Neck: no JVD, carotid bruits, or masses Cardiac: RRR; no murmurs, rubs, or gallops,no edema.  Intact distal pulses bilaterally.  Respiratory:  clear to auscultation bilaterally, normal work of breathing GI: soft, nontender, nondistended, + BS MS: no deformity or atrophy  Skin: warm and dry, no rash Neuro:  Alert and Oriented x 3, Strength and sensation are intact Psych: euthymic mood, full affect  Wt Readings from Last 3 Encounters:  04/13/16 187 lb 12.8 oz (85.2 kg)  10/16/15 186 lb (84.4 kg)  03/17/15 190 lb 9.6 oz (86.5 kg)      Studies/Labs Reviewed:   EKG:  EKG is ordered today.  The ekg ordered today demonstrates sinus bradycardia at 56bpm with nonspecific ST abnormality - no change from  2016  Recent Labs: 10/16/2015: BUN 16; Creat 0.67; Hemoglobin 12.9; Platelets 352; Potassium 3.9; Sodium 140   Lipid Panel No results found for: CHOL, TRIG, HDL, CHOLHDL, VLDL, LDLCALC, LDLDIRECT  Additional studies/ records that were reviewed today include:  none    ASSESSMENT:    1. Persistent atrial fibrillation (HCC)   2. Essential hypertension      PLAN:  In order of problems listed above:  1. Persistent atrial fibrillation maintaining NSR.  She will continue on BB and Xarelto. Renal function stable from labs she brought in from last check 02/2016.  CBC stable.  2. HTN - BP controlled on current meds.  She will continue on diuretic/BB    Medication Adjustments/Labs and Tests Ordered: Current medicines are reviewed at length with the patient today.  Concerns regarding medicines are outlined above.  Medication changes, Labs and Tests ordered today are listed in the Patient Instructions below.  There are no Patient Instructions on file for this visit.   Signed, Armanda Magicraci Syliva Mee, MD  04/13/2016 1:20 PM    [facility] [address], [place], [place]  [address] Phone: [phone]; Fax: [phone]

## 2016-04-13 NOTE — Patient Instructions (Signed)
Medication Instructions:  Your physician recommends that you continue on your current medications as directed. Please refer to the Current Medication list given to you today.   Labwork: TODAY: BMET, CBC  Testing/Procedures: None  Follow-Up: Your physician wants you to follow-up in: 6 months with Dr. Turner's assistant. You will receive a reminder letter in the mail two months in advance. If you don't receive a letter, please call our office to schedule the follow-up appointment.   Your physician wants you to follow-up in: 1 year with Dr. Turner. You will receive a reminder letter in the mail two months in advance. If you don't receive a letter, please call our office to schedule the follow-up appointment.   Any Other Special Instructions Will Be Listed Below (If Applicable).     If you need a refill on your cardiac medications before your next appointment, please call your pharmacy.   

## 2016-09-10 ENCOUNTER — Other Ambulatory Visit: Payer: Self-pay | Admitting: Cardiology

## 2016-09-10 NOTE — Telephone Encounter (Signed)
Age 76 years Wt 82.5kg 04/13/2016 Saw Dr Mayford Knife 04/13/2016 10/16/2015 SrCr 0.67 10/16/2015 Hgb 12.9 HCT 38.t CrCl 95.94  Refill done for Xarelto 20 mg daily as requested

## 2016-09-10 NOTE — Telephone Encounter (Signed)
10/16/2015 Hgb 12.9 HCT 38.7

## 2016-10-12 ENCOUNTER — Ambulatory Visit: Payer: Medicare Other | Admitting: Physician Assistant

## 2016-11-02 ENCOUNTER — Encounter (INDEPENDENT_AMBULATORY_CARE_PROVIDER_SITE_OTHER): Payer: Self-pay

## 2016-11-02 ENCOUNTER — Ambulatory Visit (INDEPENDENT_AMBULATORY_CARE_PROVIDER_SITE_OTHER): Payer: Medicare Other | Admitting: Cardiology

## 2016-11-02 ENCOUNTER — Encounter: Payer: Self-pay | Admitting: Cardiology

## 2016-11-02 VITALS — BP 130/72 | HR 60 | Ht 64.0 in | Wt 191.4 lb

## 2016-11-02 DIAGNOSIS — Z79899 Other long term (current) drug therapy: Secondary | ICD-10-CM | POA: Diagnosis not present

## 2016-11-02 DIAGNOSIS — I480 Paroxysmal atrial fibrillation: Secondary | ICD-10-CM

## 2016-11-02 LAB — BASIC METABOLIC PANEL
BUN/Creatinine Ratio: 19 (ref 12–28)
BUN: 14 mg/dL (ref 8–27)
CO2: 23 mmol/L (ref 20–29)
CREATININE: 0.72 mg/dL (ref 0.57–1.00)
Calcium: 9 mg/dL (ref 8.7–10.3)
Chloride: 100 mmol/L (ref 96–106)
GFR calc Af Amer: 95 mL/min/{1.73_m2} (ref >59)
GFR, EST NON AFRICAN AMERICAN: 83 mL/min/{1.73_m2} (ref >59)
GLUCOSE: 84 mg/dL (ref 65–99)
Potassium: 4.3 mmol/L (ref 3.5–5.2)
Sodium: 139 mmol/L (ref 134–144)

## 2016-11-02 LAB — CBC WITH DIFFERENTIAL/PLATELET
BASOS ABS: 0.1 10*3/uL (ref 0.0–0.2)
BASOS: 1 %
EOS (ABSOLUTE): 0.2 10*3/uL (ref 0.0–0.4)
EOS: 3 %
HEMATOCRIT: 38.6 % (ref 34.0–46.6)
HEMOGLOBIN: 13 g/dL (ref 11.1–15.9)
IMMATURE GRANS (ABS): 0 10*3/uL (ref 0.0–0.1)
Immature Granulocytes: 0 %
Lymphocytes Absolute: 2.2 10*3/uL (ref 0.7–3.1)
Lymphs: 30 %
MCH: 29.7 pg (ref 26.6–33.0)
MCHC: 33.7 g/dL (ref 31.5–35.7)
MCV: 88 fL (ref 79–97)
Monocytes Absolute: 0.8 10*3/uL (ref 0.1–0.9)
Monocytes: 11 %
NEUTROS ABS: 4.1 10*3/uL (ref 1.4–7.0)
Neutrophils: 55 %
Platelets: 344 10*3/uL (ref 150–379)
RBC: 4.38 x10E6/uL (ref 3.77–5.28)
RDW: 13.5 % (ref 12.3–15.4)
WBC: 7.3 10*3/uL (ref 3.4–10.8)

## 2016-11-02 NOTE — Progress Notes (Signed)
11/02/2016 Teresa White   Mar 31, 1940  [id number]  Primary Physician Renford Dills, MD Primary Cardiologist: Dr. Mayford Knife   Reason for Visit/CC: f/u for PAF and HTN  HPI:  Teresa White is a 76 y.o. female , followed by Dr. Mayford Knife, with a h/o PAF and HTN. She is on rate control therapy with metoprolol for PAF. She is also on Xarelto for a/c. She reports full compliance. No abnormal bleeding. No falls. She also denies palpitations, chest pain and dyspnea. No LEE, orthopnea or PND. Exam reveals RRR. BP is well controlled. There is no h/o CAD. She had a stress test in 2016 that was negative for ischemia.   Current Meds  Medication Sig  . acetaminophen (TYLENOL) 325 MG tablet Take 325 mg by mouth 2 (two) times daily. For pain  . hydrochlorothiazide (HYDRODIURIL) 12.5 MG tablet Take 1 tablet by mouth every morning.  . metoprolol (LOPRESSOR) 100 MG tablet Take 100 mg by mouth 2 (two) times daily.  . simethicone (MYLICON) 125 MG chewable tablet Chew 125 mg by mouth daily as needed for flatulence. For gas  . simvastatin (ZOCOR) 20 MG tablet Take 20 mg by mouth every evening.  . traMADol (ULTRAM) 50 MG tablet Take 50 mg by mouth every 12 (twelve) hours as needed for moderate pain.   Marland Kitchen XARELTO 20 MG TABS tablet TAKE 1 TABLET (20 MG TOTAL) BY MOUTH DAILY.   Allergies  Allergen Reactions  . Azithromycin Other (See Comments)    Causes AFIB  . Demerol Nausea And Vomiting  . Erythromycin Other (See Comments)    "causes A-Fib"  . Flonase [Fluticasone Propionate]     Created sores in her nose  . Meperidine Nausea And Vomiting  . Vicodin [Hydrocodone-Acetaminophen]    Past Medical History:  Diagnosis Date  . Anxiety   . Back pain    history of abnormal MRI mild spinal stenosis at L3-4 and L4-5 small central disc herniation at L2-3 small L sided disc and Herniation at L1  . Depression   . Diastolic dysfunction   . Dilated aortic root (HCC)   . H/O echocardiogram 03/13   Normal EF  and normal Stress test  . Hypercholesteremia   . Hypertension   . Obesity (BMI 30-39.9) 10/16/2015  . Overweight   . PAF (paroxysmal atrial fibrillation) (HCC)   . PVC (premature ventricular contraction)    Family History  Problem Relation Age of Onset  . Kidney disease Mother   . Other Father        ALCHOLIC  . Diabetes Sister   . Diabetes Brother    Past Surgical History:  Procedure Laterality Date  . ABDOMINAL SURGERY     Social History   Social History  . Marital status: Single    Spouse name: N/A  . Number of children: N/A  . Years of education: N/A   Occupational History  . Not on file.   Social History Main Topics  . Smoking status: Former Smoker    Types: Cigarettes    Quit date: 01/18/1998  . Smokeless tobacco: Never Used  . Alcohol use No  . Drug use: No  . Sexual activity: Not on file   Other Topics Concern  . Not on file   Social History Narrative  . No narrative on file     Review of Systems: General: negative for chills, fever, night sweats or weight changes.  Cardiovascular: negative for chest pain, dyspnea on exertion, edema, orthopnea, palpitations, paroxysmal nocturnal dyspnea  or shortness of breath Dermatological: negative for rash Respiratory: negative for cough or wheezing Urologic: negative for hematuria Abdominal: negative for nausea, vomiting, diarrhea, bright red blood per rectum, melena, or hematemesis Neurologic: negative for visual changes, syncope, or dizziness All other systems reviewed and are otherwise negative except as noted above.   Physical Exam:  Blood pressure 130/72, pulse 60, height  (1.626 m), weight 191 lb 6.4 oz (86.8 kg).  General appearance: alert, cooperative and no distress Neck: no carotid bruit and no JVD Lungs: clear to auscultation bilaterally Heart: regular rate and rhythm, S1, S2 normal, no murmur, click, rub or gallop Extremities: extremities normal, atraumatic, no cyanosis or edema Pulses: 2+ and  symmetric Skin: Skin color, texture, turgor normal. No rashes or lesions Neurologic: Grossly normal  EKG not performed -- personally reviewed   ASSESSMENT AND PLAN:   1. PAF: RRR on exam. She denies any symptoms. HR is controlled with BB. She is on Xarelto for a/c. No abnormal bleeding or falls. We will check a CBC and BMP for medication monitoring.   2. HTN: controlled on current regimen. No changes made today. She is on HCTZ. We will check BMP today for medication monitoring.    Follow-Up w/ Dr. Mayford Knife in 6 months  Teresa White, M[facility] 11/02/2016 10:03 AM

## 2016-11-02 NOTE — Patient Instructions (Signed)
Your physician recommends that you continue on your current medications as directed. Please refer to the Current Medication list given to you today.  Your physician recommends that you return for lab work in: TODAY  BMET  AND CBC   Your physician wants you to follow-up in: 6 MONTHS  WITH  DR  TURNER You will receive a reminder letter in the mail two months in advance. If you don't receive a letter, please call our office to schedule the follow-up appointment. 

## 2016-11-04 ENCOUNTER — Telehealth: Payer: Self-pay | Admitting: *Deleted

## 2016-11-04 NOTE — Telephone Encounter (Signed)
-----   Message from Allayne ButcherBrittainy M Simmons, [place] sent at 11/03/2016  5:23 PM EDT ----- Labs look ok. Continue current meds as directed. No changes.

## 2016-11-04 NOTE — Telephone Encounter (Signed)
Patient informed. 

## 2017-02-28 ENCOUNTER — Other Ambulatory Visit: Payer: Self-pay | Admitting: *Deleted

## 2017-02-28 MED ORDER — RIVAROXABAN 20 MG PO TABS: ORAL_TABLET | ORAL | 1 refills | Status: DC

## 2017-02-28 NOTE — Telephone Encounter (Signed)
Pt last saw Dr Mayford Knifeurner 04/13/16, last labs 11/02/16 Creat 0.72, age 77, weight 86.8kg, CrCl 93.93, based on CrCl pt is on appropriate dosage of Xarelto 20mg  QD.  Will refill rx.

## 2017-03-01 ENCOUNTER — Telehealth: Payer: Self-pay | Admitting: Cardiology

## 2017-03-01 NOTE — Telephone Encounter (Signed)
Spoke with patient. Patient wanted to know if she could take miralax with eliquis. Informed patient that miralax is okay to take with eliquis. Patient verbalized understanding and thanked me for the call.

## 2017-03-01 NOTE — Telephone Encounter (Signed)
Per pt call she has a question about Medication please give her a call back

## 2017-03-22 ENCOUNTER — Telehealth: Payer: Self-pay | Admitting: Cardiology

## 2017-03-22 NOTE — Telephone Encounter (Signed)
Walk In pt Form-pt has question about med. Placed in Dr.Turner doc box

## 2017-05-05 ENCOUNTER — Encounter: Payer: Self-pay | Admitting: Cardiology

## 2017-05-05 ENCOUNTER — Ambulatory Visit (INDEPENDENT_AMBULATORY_CARE_PROVIDER_SITE_OTHER): Payer: Medicare Other | Admitting: Cardiology

## 2017-05-05 VITALS — BP 140/88 | HR 62 | Ht 64.0 in | Wt 173.1 lb

## 2017-05-05 DIAGNOSIS — I10 Essential (primary) hypertension: Secondary | ICD-10-CM | POA: Diagnosis not present

## 2017-05-05 DIAGNOSIS — E669 Obesity, unspecified: Secondary | ICD-10-CM | POA: Diagnosis not present

## 2017-05-05 DIAGNOSIS — I481 Persistent atrial fibrillation: Secondary | ICD-10-CM

## 2017-05-05 DIAGNOSIS — I4819 Other persistent atrial fibrillation: Secondary | ICD-10-CM

## 2017-05-05 NOTE — Patient Instructions (Signed)
Medication Instructions:  Your physician recommends that you continue on your current medications as directed. Please refer to the Current Medication list given to you today.  If you need a refill on your cardiac medications, please contact your pharmacy first.  Labwork: None ordered   Testing/Procedures: None ordered   Follow-Up: Your physician wants you to follow-up in: 6 months with Dr. Turner. You will receive a reminder letter in the mail two months in advance. If you don't receive a letter, please call our office to schedule the follow-up appointment.  Any Other Special Instructions Will Be Listed Below (If Applicable).   Thank you for choosing [facility]    Rena Monetta Lick, RN  [phone]  If you need a refill on your cardiac medications before your next appointment, please call your pharmacy.   

## 2017-05-05 NOTE — Progress Notes (Signed)
Cardiology Office Note:    Date:  05/05/2017   ID:  Teresa White, DOB 1940/05/08, MRN [id number]  PCP:  Renford DillsPolite, Ronald, MD  Cardiologist:  No primary care provider on file.    Referring MD: Renford DillsPolite, Ronald, MD   Chief Complaint  Patient presents with  . Atrial Fibrillation  . Hypertension    History of Present Illness:    Teresa White is a 77 y.o. female with a hx of persistent atrial fibrillation and HTN.  She is here today for followup and is doing well.  She denies any chest pain or pressure, SOB, DOE, PND, orthopnea, LE edema, dizziness, palpitations or syncope. She is compliant with her meds and is tolerating meds with no SE.    Past Medical History:  Diagnosis Date  . Anxiety   . Back pain    history of abnormal MRI mild spinal stenosis at L3-4 and L4-5 small central disc herniation at L2-3 small L sided disc and Herniation at L1  . Depression   . Diastolic dysfunction   . Dilated aortic root (HCC)   . H/O echocardiogram 03/13   Normal EF and normal Stress test  . Hypercholesteremia   . Hypertension   . Obesity (BMI 30-39.9) 10/16/2015  . Overweight   . PAF (paroxysmal atrial fibrillation) (HCC)   . PVC (premature ventricular contraction)     Past Surgical History:  Procedure Laterality Date  . ABDOMINAL SURGERY      Current Medications: Current Meds  Medication Sig  . acetaminophen (TYLENOL) 325 MG tablet Take 325 mg by mouth 2 (two) times daily. For pain  . hydrochlorothiazide (HYDRODIURIL) 12.5 MG tablet Take 1 tablet by mouth every morning.  . metoprolol (LOPRESSOR) 100 MG tablet Take 100 mg by mouth 2 (two) times daily.  . rivaroxaban (XARELTO) 20 MG TABS tablet TAKE 1 TABLET (20 MG TOTAL) BY MOUTH DAILY.  . simethicone (MYLICON) 125 MG chewable tablet Chew 125 mg by mouth daily as needed for flatulence. For gas  . simvastatin (ZOCOR) 20 MG tablet Take 20 mg by mouth every evening.  . traMADol (ULTRAM) 50 MG tablet Take 50 mg by mouth every 12  (twelve) hours as needed for moderate pain.      Allergies:   Azithromycin; Demerol; Erythromycin; Demerol  [meperidine hcl]; Flonase [fluticasone propionate]; Meperidine; and Vicodin [hydrocodone-acetaminophen]   Social History   Socioeconomic History  . Marital status: Single    Spouse name: Not on file  . Number of children: Not on file  . Years of education: Not on file  . Highest education level: Not on file  Occupational History  . Not on file  Social Needs  . Financial resource strain: Not on file  . Food insecurity:    Worry: Not on file    Inability: Not on file  . Transportation needs:    Medical: Not on file    Non-medical: Not on file  Tobacco Use  . Smoking status: Former Smoker    Types: Cigarettes    Last attempt to quit: 01/18/1998    Years since quitting: 19.3  . Smokeless tobacco: Never Used  Substance and Sexual Activity  . Alcohol use: No    Alcohol/week: 0.0 oz  . Drug use: No  . Sexual activity: Not on file  Lifestyle  . Physical activity:    Days per week: Not on file    Minutes per session: Not on file  . Stress: Not on file  Relationships  .  Social connections:    Talks on phone: Not on file    Gets together: Not on file    Attends religious service: Not on file    Active member of club or organization: Not on file    Attends meetings of clubs or organizations: Not on file    Relationship status: Not on file  Other Topics Concern  . Not on file  Social History Narrative  . Not on file     Family History: The patient's family history includes Diabetes in her brother and sister; Kidney disease in her mother; Other in her father.  ROS:   Please see the history of present illness.    ROS  All other systems reviewed and negative.   EKGs/Labs/Other Studies Reviewed:    The following studies were reviewed today: none  EKG:  EKG is  ordered today.  The ekg ordered today demonstrates NSR at 62bpm with nonspecific ST abnormality  Recent  Labs: 11/02/2016: BUN 14; Creatinine, Ser 0.72; Hemoglobin 13.0; Platelets 344; Potassium 4.3; Sodium 139   Recent Lipid Panel No results found for: CHOL, TRIG, HDL, CHOLHDL, VLDL, LDLCALC, LDLDIRECT  Physical Exam:    VS:  BP 140/88   Pulse 62   Ht 5\' 4"  (1.626 m)   Wt 173 lb 1.9 oz (78.5 kg)   SpO2 92%   BMI 29.72 kg/m     Wt Readings from Last 3 Encounters:  05/05/17 173 lb 1.9 oz (78.5 kg)  11/02/16 191 lb 6.4 oz (86.8 kg)  04/13/16 187 lb 12.8 oz (85.2 kg)     GEN:  Well nourished, well developed in no acute distress HEENT: Normal NECK: No JVD; No carotid bruits LYMPHATICS: No lymphadenopathy CARDIAC: RRR, no murmurs, rubs, gallops RESPIRATORY:  Clear to auscultation without rales, wheezing or rhonchi  ABDOMEN: Soft, non-tender, non-distended MUSCULOSKELETAL:  No edema; No deformity  SKIN: Warm and dry NEUROLOGIC:  Alert and oriented x 3 PSYCHIATRIC:  Normal affect   ASSESSMENT:    1. Persistent atrial fibrillation (HCC)   2. Essential hypertension   3. Obesity (BMI 30-39.9)    PLAN:    In order of problems listed above:  1.  Persistent atrial fibrillation -normal sinus rhythm on exam today.  She will continue on metoprolol 100 mg twice daily and Xarelto 20 mg daily.  Her creatinine is stable at 0.79 and hemoglobin 13.6 done February March 2019.  She not had any bleeding problems.  2.  Hypertension -blood pressures well controlled on exam today.  She will continue on HCTZ 12.5 mg daily and Lopressor 100 mg twice daily.  3.  Obesity - I have encouraged her to get into a routine exercise program and cut back on carbs and portions. She has lost 17lbs since November and I congratulated her.   Medication Adjustments/Labs and Tests Ordered: Current medicines are reviewed at length with the patient today.  Concerns regarding medicines are outlined above.  No orders of the defined types were placed in this encounter.  No orders of the defined types were placed in  this encounter.   Signed, Armanda Magic, MD  05/05/2017 2:47 PM    [facility]

## 2017-09-02 ENCOUNTER — Telehealth: Payer: Self-pay | Admitting: Pharmacist

## 2017-09-02 ENCOUNTER — Encounter (INDEPENDENT_AMBULATORY_CARE_PROVIDER_SITE_OTHER): Payer: Self-pay

## 2017-09-02 ENCOUNTER — Other Ambulatory Visit: Payer: Self-pay | Admitting: Pharmacist

## 2017-09-02 ENCOUNTER — Other Ambulatory Visit: Payer: Medicare Other | Admitting: *Deleted

## 2017-09-02 DIAGNOSIS — I4819 Other persistent atrial fibrillation: Secondary | ICD-10-CM

## 2017-09-02 MED ORDER — RIVAROXABAN 20 MG PO TABS: ORAL_TABLET | ORAL | 1 refills | Status: DC

## 2017-09-02 NOTE — Telephone Encounter (Signed)
Pt walked in asking for Xarelto refill. She is due for annual CBC and BMET. Added pt to lab schedule today and will send refill in pending normal results.

## 2017-09-03 LAB — CBC
HEMATOCRIT: 40.2 % (ref 34.0–46.6)
Hemoglobin: 13.5 g/dL (ref 11.1–15.9)
MCH: 29.9 pg (ref 26.6–33.0)
MCHC: 33.6 g/dL (ref 31.5–35.7)
MCV: 89 fL (ref 79–97)
Platelets: 374 10*3/uL (ref 150–450)
RBC: 4.52 x10E6/uL (ref 3.77–5.28)
RDW: 13.9 % (ref 12.3–15.4)
WBC: 9.5 10*3/uL (ref 3.4–10.8)

## 2017-09-03 LAB — BASIC METABOLIC PANEL
BUN / CREAT RATIO: 18 (ref 12–28)
BUN: 14 mg/dL (ref 8–27)
CO2: 26 mmol/L (ref 20–29)
Calcium: 9.6 mg/dL (ref 8.7–10.3)
Chloride: 99 mmol/L (ref 96–106)
Creatinine, Ser: 0.76 mg/dL (ref 0.57–1.00)
GFR calc non Af Amer: 77 mL/min/{1.73_m2} (ref >59)
GFR, EST AFRICAN AMERICAN: 89 mL/min/{1.73_m2} (ref >59)
Glucose: 93 mg/dL (ref 65–99)
Potassium: 4.3 mmol/L (ref 3.5–5.2)
Sodium: 140 mmol/L (ref 134–144)

## 2017-09-06 NOTE — Telephone Encounter (Signed)
Pt has been made aware of normal result and verbalized understanding.  jw 09/06/17

## 2017-11-16 ENCOUNTER — Ambulatory Visit (INDEPENDENT_AMBULATORY_CARE_PROVIDER_SITE_OTHER): Payer: Medicare Other | Admitting: Cardiology

## 2017-11-16 VITALS — BP 140/78 | HR 60 | Ht 64.0 in | Wt 171.0 lb

## 2017-11-16 DIAGNOSIS — I4819 Other persistent atrial fibrillation: Secondary | ICD-10-CM

## 2017-11-16 DIAGNOSIS — I10 Essential (primary) hypertension: Secondary | ICD-10-CM

## 2017-11-16 DIAGNOSIS — E669 Obesity, unspecified: Secondary | ICD-10-CM | POA: Diagnosis not present

## 2017-11-16 NOTE — Progress Notes (Signed)
Cardiology Office Note:    Date:  11/16/2017   ID:  Teresa White, DOB 1940-10-17, MRN [id number]  PCP:  Renford Dills, MD  Cardiologist:  No primary care provider on file.    Referring MD: Renford Dills, MD   Chief Complaint  Patient presents with  . Atrial Fibrillation  . Hypertension    History of Present Illness:    Teresa White is a 77 y.o. female with a hx of persistentatrial fibrillation and HTN.  She is here today for followup and is doing well. She denies any chest pain or pressure, SOB, DOE, PND, orthopnea, LE edema, dizziness, palpitations or syncope. She is compliant with her meds and is tolerating meds with no SE.    Past Medical History:  Diagnosis Date  . Anxiety   . Back pain    history of abnormal MRI mild spinal stenosis at L3-4 and L4-5 small central disc herniation at L2-3 small L sided disc and Herniation at L1  . Depression   . Diastolic dysfunction   . Dilated aortic root (HCC)   . H/O echocardiogram 03/13   Normal EF and normal Stress test  . Hypercholesteremia   . Hypertension   . Obesity (BMI 30-39.9) 10/16/2015  . Overweight   . PAF (paroxysmal atrial fibrillation) (HCC)   . PVC (premature ventricular contraction)     Past Surgical History:  Procedure Laterality Date  . ABDOMINAL SURGERY      Current Medications: Current Meds  Medication Sig  . acetaminophen (TYLENOL) 325 MG tablet Take 325 mg by mouth 2 (two) times daily. For pain  . hydrochlorothiazide (HYDRODIURIL) 12.5 MG tablet Take 1 tablet by mouth every morning.  . metoprolol (LOPRESSOR) 100 MG tablet Take 100 mg by mouth 2 (two) times daily.  . rivaroxaban (XARELTO) 20 MG TABS tablet TAKE 1 TABLET (20 MG TOTAL) BY MOUTH DAILY.  . simethicone (MYLICON) 125 MG chewable tablet Chew 125 mg by mouth daily as needed for flatulence. For gas  . simvastatin (ZOCOR) 20 MG tablet Take 20 mg by mouth every evening.  . traMADol (ULTRAM) 50 MG tablet Take 50 mg by mouth every 12  (twelve) hours as needed for moderate pain.      Allergies:   Azithromycin; Demerol; Erythromycin; Demerol  [meperidine hcl]; Flonase [fluticasone propionate]; Meperidine; and Vicodin [hydrocodone-acetaminophen]   Social History   Socioeconomic History  . Marital status: Single    Spouse name: Not on file  . Number of children: Not on file  . Years of education: Not on file  . Highest education level: Not on file  Occupational History  . Not on file  Social Needs  . Financial resource strain: Not on file  . Food insecurity:    Worry: Not on file    Inability: Not on file  . Transportation needs:    Medical: Not on file    Non-medical: Not on file  Tobacco Use  . Smoking status: Former Smoker    Types: Cigarettes    Last attempt to quit: 01/18/1998    Years since quitting: 19.8  . Smokeless tobacco: Never Used  Substance and Sexual Activity  . Alcohol use: No    Alcohol/week: 0.0 standard drinks  . Drug use: No  . Sexual activity: Not on file  Lifestyle  . Physical activity:    Days per week: Not on file    Minutes per session: Not on file  . Stress: Not on file  Relationships  . Social  connections:    Talks on phone: Not on file    Gets together: Not on file    Attends religious service: Not on file    Active member of club or organization: Not on file    Attends meetings of clubs or organizations: Not on file    Relationship status: Not on file  Other Topics Concern  . Not on file  Social History Narrative  . Not on file     Family History: The patient's family history includes Diabetes in her brother and sister; Kidney disease in her mother; Other in her father.  ROS:   Please see the history of present illness.    ROS  All other systems reviewed and negative.   EKGs/Labs/Other Studies Reviewed:    The following studies were reviewed today: none  EKG:  EKG is not ordered today.    Recent Labs: 09/02/2017: BUN 14; Creatinine, Ser 0.76; Hemoglobin  13.5; Platelets 374; Potassium 4.3; Sodium 140   Recent Lipid Panel No results found for: CHOL, TRIG, HDL, CHOLHDL, VLDL, LDLCALC, LDLDIRECT  Physical Exam:    VS:  BP 140/78   Pulse 60   Ht 5\' 4"  (1.626 m)   Wt 171 lb (77.6 kg)   BMI 29.35 kg/m     Wt Readings from Last 3 Encounters:  11/16/17 171 lb (77.6 kg)  05/05/17 173 lb 1.9 oz (78.5 kg)  11/02/16 191 lb 6.4 oz (86.8 kg)     GEN:  Well nourished, well developed in no acute distress HEENT: Normal NECK: No JVD; No carotid bruits LYMPHATICS: No lymphadenopathy CARDIAC: RRR, no murmurs, rubs, gallops RESPIRATORY:  Clear to auscultation without rales, wheezing or rhonchi  ABDOMEN: Soft, non-tender, non-distended MUSCULOSKELETAL:  No edema; No deformity  SKIN: Warm and dry NEUROLOGIC:  Alert and oriented x 3 PSYCHIATRIC:  Normal affect   ASSESSMENT:    1. Persistent atrial fibrillation   2. Essential hypertension   3. Obesity (BMI 30-39.9)    PLAN:    In order of problems listed above:  1.  Persistent atrial fibrillation -she is maintaining normal sinus rhythm on exam today.  She will continue on Lopressor 100 mg twice daily and Xarelto 20 mg daily.  Creatinine was normal at 0.73 and potassium 4.5 on 09/29/2017.  Hemoglobin was stable at 13.1.  Her creatinine clearance is 81.53 cc/min.  2. HTN -BP is well controlled on exam today.  She will continue on HCTZ 12.5 mg daily and Lopressor 100 mg twice daily.  3.  Obesity - I have encouraged her to get into a routine exercise program and cut back on carbs and portions.    Medication Adjustments/Labs and Tests Ordered: Current medicines are reviewed at length with the patient today.  Concerns regarding medicines are outlined above.  No orders of the defined types were placed in this encounter.  No orders of the defined types were placed in this encounter.   Signed, Armanda Magic, MD  11/16/2017 1:07 PM     [facility]

## 2017-11-16 NOTE — Patient Instructions (Signed)
Medication Instructions:  Your physician recommends that you continue on your current medications as directed. Please refer to the Current Medication list given to you today.  If you need a refill on your cardiac medications before your next appointment, please call your pharmacy.   Lab work:  If you have labs (blood work) drawn today and your tests are completely normal, you will receive your results only by: . MyChart Message (if you have MyChart) OR . A paper copy in the mail If you have any lab test that is abnormal or we need to change your treatment, we will call you to review the results.  Follow-Up: At [facility], you and your health needs are our priority.  As part of our continuing mission to provide you with exceptional heart care, we have created designated Provider Care Teams.  These Care Teams include your primary Cardiologist (physician) and Advanced Practice Providers (APPs -  Physician Assistants and Nurse Practitioners) who all work together to provide you with the care you need, when you need it. You will need a follow up appointment in 6 months.  Please call our office 2 months in advance to schedule this appointment.  You may see Dr. Turner or one of the following Advanced Practice Providers on your designated Care Team:   Brittainy Simmons, PA-C Dayna Dunn, PA-C . Michele Lenze, PA-C    

## 2018-02-20 ENCOUNTER — Other Ambulatory Visit: Payer: Self-pay | Admitting: Cardiology

## 2018-05-30 ENCOUNTER — Telehealth: Payer: Self-pay | Admitting: Cardiology

## 2018-05-30 NOTE — Telephone Encounter (Signed)
Pt. Does not have smart phone but she gives consent for a phone visit.    Virtual Visit Pre-Appointment Phone Call  "(Name), I am calling you today to discuss your upcoming appointment. We are currently trying to limit exposure to the virus that causes COVID-19 by seeing patients at home rather than in the office."  1. "What is the BEST phone number to call the day of the visit?" - include this in appointment notes  2. Do you have or have access to (through a family member/friend) a smartphone with video capability that we can use for your visit?" a. If yes - list this number in appt notes as cell (if different from BEST phone #) and list the appointment type as a VIDEO visit in appointment notes b. If no - list the appointment type as a PHONE visit in appointment notes  3. Confirm consent - "In the setting of the current Covid19 crisis, you are scheduled for a (phone or video) visit with your provider on (date) at (time).  Just as we do with many in-office visits, in order for you to participate in this visit, we must obtain consent.  If you'd like, I can send this to your mychart (if signed up) or email for you to review.  Otherwise, I can obtain your verbal consent now.  All virtual visits are billed to your insurance company just like a normal visit would be.  By agreeing to a virtual visit, we'd like you to understand that the technology does not allow for your provider to perform an examination, and thus may limit your provider's ability to fully assess your condition. If your provider identifies any concerns that need to be evaluated in person, we will make arrangements to do so.  Finally, though the technology is pretty good, we cannot assure that it will always work on either your or our end, and in the setting of a video visit, we may have to convert it to a phone-only visit.  In either situation, we cannot ensure that we have a secure connection.  Are you willing to proceed?"   YES 4. Advise patient to be prepared - "Two hours prior to your appointment, go ahead and check your blood pressure, pulse, oxygen saturation, and your weight (if you have the equipment to check those) and write them all down. When your visit starts, your provider will ask you for this information. If you have an Apple Watch or Kardia device, please plan to have heart rate information ready on the day of your appointment. Please have a pen and paper handy nearby the day of the visit as well."  5. Give patient instructions for MyChart download to smartphone OR [email or web address] as below if video visit (depending on what platform provider is using)  6. Inform patient they will receive a phone call 15 minutes prior to their appointment time (may be from unknown caller ID) so they should be prepared to answer    TELEPHONE CALL NOTE  Teresa White has been deemed a candidate for a follow-up tele-health visit to limit community exposure during the Covid-19 pandemic. I spoke with the patient via phone to ensure availability of phone/video source, confirm preferred email & phone number, and discuss instructions and expectations.  I reminded Teresa White to be prepared with any vital sign and/or heart rhythm information that could potentially be obtained via home monitoring, at the time of her visit. I reminded Teresa White to expect a phone call  prior to her visit.  Scherrie Bateman 05/30/2018 2:56 PM   INSTRUCTIONS FOR DOWNLOADING THE MYCHART APP TO SMARTPHONE  - The patient must first make sure to have activated MyChart and know their login information - If Apple, go to [employer] and type in MyChart in the search bar and download the app. If Android, ask patient to go to [employer] and type in [place] in the search bar and download the app. The app is free but as with any other app downloads, their phone may require them to verify saved payment information or Apple/Android  password.  - The patient will need to then log into the app with their MyChart username and password, and select [facility] as their healthcare provider to link the account. When it is time for your visit, go to the MyChart app, find appointments, and click Begin Video Visit. Be sure to Select Allow for your device to access the Microphone and Camera for your visit. You will then be connected, and your provider will be with you shortly.  **If they have any issues connecting, or need assistance please contact MyChart service desk [phone])**  **If using a computer, in order to ensure the best quality for their visit they will need to use either of the following Internet Browsers: [employer] or Google Chrome**  IF USING DOXIMITY or DOXY.ME - The patient will receive a link just prior to their visit by text.     FULL LENGTH CONSENT FOR TELE-HEALTH VISIT   I hereby voluntarily request, consent and authorize [facility] and its employed or contracted physicians, physician assistants, nurse practitioners or other licensed health care professionals (the Practitioner), to provide me with telemedicine health care services (the Services") as deemed necessary by the treating Practitioner. I acknowledge and consent to receive the Services by the Practitioner via telemedicine. I understand that the telemedicine visit will involve communicating with the Practitioner through live audiovisual communication technology and the disclosure of certain medical information by electronic transmission. I acknowledge that I have been given the opportunity to request an in-person assessment or other available alternative prior to the telemedicine visit and am voluntarily participating in the telemedicine visit.  I understand that I have the right to withhold or withdraw my consent to the use of telemedicine in the course of my care at any time, without affecting my right to future care or treatment,  and that the Practitioner or I may terminate the telemedicine visit at any time. I understand that I have the right to inspect all information obtained and/or recorded in the course of the telemedicine visit and may receive copies of available information for a reasonable fee.  I understand that some of the potential risks of receiving the Services via telemedicine include:   Delay or interruption in medical evaluation due to technological equipment failure or disruption;  Information transmitted may not be sufficient (e.g. poor resolution of images) to allow for appropriate medical decision making by the Practitioner; and/or   In rare instances, security protocols could fail, causing a breach of personal health information.  Furthermore, I acknowledge that it is my responsibility to provide information about my medical history, conditions and care that is complete and accurate to the best of my ability. I acknowledge that Practitioner's advice, recommendations, and/or decision may be based on factors not within their control, such as incomplete or inaccurate data provided by me or distortions of diagnostic images or specimens that may result from electronic transmissions.  I understand that the practice of medicine is not an exact science and that Practitioner makes no warranties or guarantees regarding treatment outcomes. I acknowledge that I will receive a copy of this consent concurrently upon execution via email to the email address I last provided but may also request a printed copy by calling the office of [facility].    I understand that my insurance will be billed for this visit.   I have read or had this consent read to me.  I understand the contents of this consent, which adequately explains the benefits and risks of the Services being provided via telemedicine.   I have been provided ample opportunity to ask questions regarding this consent and the Services and have had my questions  answered to my satisfaction.  I give my informed consent for the services to be provided through the use of telemedicine in my medical care  By participating in this telemedicine visit I agree to the above.

## 2018-06-01 NOTE — Progress Notes (Signed)
Virtual Visit via Video Note   This visit type was conducted due to national recommendations for restrictions regarding the COVID-19 Pandemic (e.g. social distancing) in an effort to limit this patient's exposure and mitigate transmission in our community.  Due to her co-morbid illnesses, this patient is at least at moderate risk for complications without adequate follow up.  This format is felt to be most appropriate for this patient at this time.  All issues noted in this document were discussed and addressed.  A limited physical exam was performed with this format.  Please refer to the patient's chart for her consent to telehealth for [facility].   Evaluation Performed:  Follow-up visit  This visit type was conducted due to national recommendations for restrictions regarding the COVID-19 Pandemic (e.g. social distancing).  This format is felt to be most appropriate for this patient at this time.  All issues noted in this document were discussed and addressed.  No physical exam was performed (except for noted visual exam findings with Video Visits).  Please refer to the patient's chart (MyChart message for video visits and phone note for telephone visits) for the patient's consent to telehealth for [facility].  Date:  06/02/2018   ID:  Teresa White, DOB 1940/01/20, MRN [id number]  Patient Location:  Home  Provider location:   [place]  PCP:  Renford Dills, MD  Cardiologist:  Armanda Magic, MD  Electrophysiologist:  None   Chief Complaint:  Atrial fibrillation and HTN  History of Present Illness:    Teresa White is a 78 y.o. female who presents via audio/video conferencing for a telehealth visit today.    Teresa White is a 78 y.o. female with a hx of persistentatrial fibrillation and HTN.  She is here today for followup and is doing well.  She denies any chest pain or pressure, SOB, DOE, PND, orthopnea, LE edema, dizziness or syncope. She occasionally  notices some skipping of her heart beat but no afib.  This occurs mainly when she is under stress.  She is compliant with her meds and is tolerating meds with no SE.    The patient does not have symptoms concerning for COVID-19 infection (fever, chills, cough, or new shortness of breath).   Prior CV studies:   The following studies were reviewed today:  none  Past Medical History:  Diagnosis Date  . Anxiety   . Back pain    history of abnormal MRI mild spinal stenosis at L3-4 and L4-5 small central disc herniation at L2-3 small L sided disc and Herniation at L1  . Depression   . Diastolic dysfunction   . Dilated aortic root (HCC)   . H/O echocardiogram 03/13   Normal EF and normal Stress test  . Hypercholesteremia   . Hypertension   . Obesity (BMI 30-39.9) 10/16/2015  . Overweight   . PAF (paroxysmal atrial fibrillation) (HCC)   . PVC (premature ventricular contraction)    Past Surgical History:  Procedure Laterality Date  . ABDOMINAL SURGERY       Current Meds  Medication Sig  . acetaminophen (TYLENOL) 325 MG tablet Take 325 mg by mouth 2 (two) times daily. For pain  . ALPRAZolam (XANAX) 0.25 MG tablet Take 0.25 mg by mouth as directed.  . hydrochlorothiazide (HYDRODIURIL) 12.5 MG tablet Take 1 tablet by mouth every morning.  . metoprolol (LOPRESSOR) 100 MG tablet Take 100 mg by mouth 2 (two) times daily.  . simethicone (MYLICON) 125 MG chewable tablet  Chew 125 mg by mouth daily as needed for flatulence. For gas  . simvastatin (ZOCOR) 20 MG tablet Take 20 mg by mouth every evening.  . traMADol (ULTRAM) 50 MG tablet Take 50 mg by mouth every 12 (twelve) hours as needed for moderate pain.   Marland Kitchen VITAMIN D PO Take by mouth daily.  [name] 20 MG TABS tablet TAKE 1 TABLET BY MOUTH EVERY DAY     Allergies:   Azithromycin; Demerol; Erythromycin; Demerol  [meperidine hcl]; Flonase [fluticasone propionate]; Meperidine; and Vicodin [hydrocodone-acetaminophen]   Social History    Tobacco Use  . Smoking status: Former Smoker    Types: Cigarettes    Last attempt to quit: 01/18/1998    Years since quitting: 20.3  . Smokeless tobacco: Never Used  Substance Use Topics  . Alcohol use: No    Alcohol/week: 0.0 standard drinks  . Drug use: No     Family Hx: The patient's family history includes Diabetes in her brother and sister; Kidney disease in her mother; Other in her father.  ROS:   Please see the history of present illness.     All other systems reviewed and are negative.   Labs/Other Tests and Data Reviewed:    Recent Labs: 09/02/2017: BUN 14; Creatinine, Ser 0.76; Hemoglobin 13.5; Platelets 374; Potassium 4.3; Sodium 140   Recent Lipid Panel No results found for: CHOL, TRIG, HDL, CHOLHDL, LDLCALC, LDLDIRECT  Wt Readings from Last 3 Encounters:  06/02/18 167 lb (75.8 kg)  11/16/17 171 lb (77.6 kg)  05/05/17 173 lb 1.9 oz (78.5 kg)     Objective:    Vital Signs:  BP 134/83   Pulse 65   Ht  (1.626 m)   Wt 167 lb (75.8 kg)   BMI 28.67 kg/m    CONSTITUTIONAL:  Well nourished, well developed female in no acute distress.  EYES: anicteric MOUTH: oral mucosa is pink RESPIRATORY: Normal respiratory effort, symmetric expansion CARDIOVASCULAR: No peripheral edema SKIN: No rash, lesions or ulcers MUSCULOSKELETAL: no digital cyanosis NEURO: Cranial Nerves II-XII grossly intact, moves all extremities PSYCH: Intact judgement and insight.  A&O x 3, Mood/affect appropriate   ASSESSMENT & PLAN:    1.  Persistent atrial fibrillation - She thinks that she is in NSR.  She has not had any palpitations.  She will continue on Lopressor  BID and Xarelto  daily for CHADS2VASC score of 4.  She has not had any bleeding problems or falls.  Her creatinine clearance is 71.14ml/min.    2.  Hypertension - her BP is controlled. She will continue on HCTZ 12.5mg  daily and Lopressor  BID.  Her last creatinine was stable at 0.81 on 03/2018.  3.  Obesity -  she walks 30 minutes 3 times daily without any problems.   4.  COVID-19 Education: The signs and symptoms of COVID-19 were discussed with the patient and how to seek care for testing (follow up with PCP or arrange E-visit).  The importance of social distancing was discussed today.  Patient Risk:   After full review of this patient's clinical status, I feel that they are at least moderate risk at this time.  Time:   Today, I have spent 15 minutes directly with the patient on video discussing medical problems including atrial fibrillation, HTN and obesity.  We also reviewed the symptoms of COVID 19 and the ways to protect against contracting the virus with telehealth technology.  I spent an additional 5 minutes reviewing patient's chart including labs.  Medication Adjustments/Labs and Tests Ordered: Current medicines are reviewed at length with the patient today.  Concerns regarding medicines are outlined above.  Tests Ordered: No orders of the defined types were placed in this encounter.  Medication Changes: No orders of the defined types were placed in this encounter.   Disposition:  Follow up in 6 month(s)  Signed, Armanda Magicraci Fredric Slabach, MD  06/02/2018 8:56 AM    [facility]

## 2018-06-02 ENCOUNTER — Other Ambulatory Visit: Payer: Self-pay

## 2018-06-02 ENCOUNTER — Telehealth (INDEPENDENT_AMBULATORY_CARE_PROVIDER_SITE_OTHER): Payer: Medicare Other | Admitting: Cardiology

## 2018-06-02 ENCOUNTER — Encounter: Payer: Self-pay | Admitting: Cardiology

## 2018-06-02 VITALS — BP 134/83 | HR 65 | Ht 64.0 in | Wt 167.0 lb

## 2018-06-02 DIAGNOSIS — Z7189 Other specified counseling: Secondary | ICD-10-CM

## 2018-06-02 DIAGNOSIS — I4819 Other persistent atrial fibrillation: Secondary | ICD-10-CM

## 2018-06-02 DIAGNOSIS — E669 Obesity, unspecified: Secondary | ICD-10-CM | POA: Diagnosis not present

## 2018-06-02 DIAGNOSIS — I10 Essential (primary) hypertension: Secondary | ICD-10-CM

## 2018-06-02 NOTE — Patient Instructions (Signed)
Medication Instructions:  No changes If you need a refill on your cardiac medications before your next appointment, please call your pharmacy.   Lab work: none If you have labs (blood work) drawn today and your tests are completely normal, you will receive your results only by: . MyChart Message (if you have MyChart) OR . A paper copy in the mail If you have any lab test that is abnormal or we need to change your treatment, we will call you to review the results.  Testing/Procedures: none  Follow-Up: At [facility], you and your health needs are our priority.  As part of our continuing mission to provide you with exceptional heart care, we have created designated Provider Care Teams.  These Care Teams include your primary Cardiologist (physician) and Advanced Practice Providers (APPs -  Physician Assistants and Nurse Practitioners) who all work together to provide you with the care you need, when you need it. You will need a follow up appointment in 6 months.  Please call our office 2 months in advance to schedule this appointment.  You may see Traci Turner, MD or one of the following Advanced Practice Providers on your designated Care Team:   Brittainy Simmons, PA-C Dayna Dunn, PA-C . Michele Lenze, PA-C  Any Other Special Instructions Will Be Listed Below (If Applicable).    

## 2018-08-09 ENCOUNTER — Other Ambulatory Visit: Payer: Self-pay | Admitting: Cardiology

## 2018-08-09 NOTE — Telephone Encounter (Signed)
Pt last saw Dr Radford Pax 06/02/18 telemedicine Covid-19, last labs 09/02/17 Creat 0.76, age 78, weight 77.6kg, CrCl 77.15, based on CrCl pt is on appropriate dosage of Xarelto 20mg  QD.  Will refill rx.

## 2018-12-30 ENCOUNTER — Emergency Department (HOSPITAL_COMMUNITY): Payer: Medicare Other

## 2018-12-30 ENCOUNTER — Encounter (HOSPITAL_COMMUNITY): Payer: Self-pay

## 2018-12-30 ENCOUNTER — Other Ambulatory Visit: Payer: Self-pay

## 2018-12-30 ENCOUNTER — Inpatient Hospital Stay (HOSPITAL_COMMUNITY)
Admission: EM | Admit: 2018-12-30 | Discharge: 2019-01-02 | DRG: 200 | Disposition: A | Discharge status: Home / Self Care | Payer: Medicare Other | Attending: General Surgery | Admitting: General Surgery

## 2018-12-30 DIAGNOSIS — Z7901 Long term (current) use of anticoagulants: Secondary | ICD-10-CM | POA: Diagnosis not present

## 2018-12-30 DIAGNOSIS — Z885 Allergy status to narcotic agent status: Secondary | ICD-10-CM

## 2018-12-30 DIAGNOSIS — F329 Major depressive disorder, single episode, unspecified: Secondary | ICD-10-CM | POA: Diagnosis present

## 2018-12-30 DIAGNOSIS — S2249XA Multiple fractures of ribs, unspecified side, initial encounter for closed fracture: Secondary | ICD-10-CM

## 2018-12-30 DIAGNOSIS — E669 Obesity, unspecified: Secondary | ICD-10-CM | POA: Diagnosis present

## 2018-12-30 DIAGNOSIS — Z79891 Long term (current) use of opiate analgesic: Secondary | ICD-10-CM

## 2018-12-30 DIAGNOSIS — S0083XA Contusion of other part of head, initial encounter: Secondary | ICD-10-CM | POA: Diagnosis present

## 2018-12-30 DIAGNOSIS — S271XXA Traumatic hemothorax, initial encounter: Secondary | ICD-10-CM | POA: Diagnosis present

## 2018-12-30 DIAGNOSIS — Z888 Allergy status to other drugs, medicaments and biological substances status: Secondary | ICD-10-CM

## 2018-12-30 DIAGNOSIS — Z20828 Contact with and (suspected) exposure to other viral communicable diseases: Secondary | ICD-10-CM | POA: Diagnosis present

## 2018-12-30 DIAGNOSIS — D62 Acute posthemorrhagic anemia: Secondary | ICD-10-CM | POA: Diagnosis present

## 2018-12-30 DIAGNOSIS — E7800 Pure hypercholesterolemia, unspecified: Secondary | ICD-10-CM | POA: Diagnosis present

## 2018-12-30 DIAGNOSIS — Z87891 Personal history of nicotine dependence: Secondary | ICD-10-CM

## 2018-12-30 DIAGNOSIS — S27321A Contusion of lung, unilateral, initial encounter: Secondary | ICD-10-CM | POA: Diagnosis present

## 2018-12-30 DIAGNOSIS — Z79899 Other long term (current) drug therapy: Secondary | ICD-10-CM

## 2018-12-30 DIAGNOSIS — I480 Paroxysmal atrial fibrillation: Secondary | ICD-10-CM | POA: Diagnosis present

## 2018-12-30 DIAGNOSIS — F419 Anxiety disorder, unspecified: Secondary | ICD-10-CM | POA: Diagnosis present

## 2018-12-30 DIAGNOSIS — I10 Essential (primary) hypertension: Secondary | ICD-10-CM | POA: Diagnosis present

## 2018-12-30 DIAGNOSIS — S2241XA Multiple fractures of ribs, right side, initial encounter for closed fracture: Secondary | ICD-10-CM | POA: Diagnosis present

## 2018-12-30 DIAGNOSIS — I77810 Thoracic aortic ectasia: Secondary | ICD-10-CM | POA: Diagnosis present

## 2018-12-30 DIAGNOSIS — Z881 Allergy status to other antibiotic agents status: Secondary | ICD-10-CM | POA: Diagnosis not present

## 2018-12-30 LAB — I-STAT CHEM 8, ED
BUN: 20 mg/dL (ref 8–23)
Calcium, Ion: 1.08 mmol/L — ABNORMAL LOW (ref 1.15–1.40)
Chloride: 104 mmol/L (ref 98–111)
Creatinine, Ser: 0.6 mg/dL (ref 0.44–1.00)
Glucose, Bld: 103 mg/dL — ABNORMAL HIGH (ref 70–99)
HCT: 42 % (ref 36.0–46.0)
Hemoglobin: 14.3 g/dL (ref 12.0–15.0)
Potassium: 3.6 mmol/L (ref 3.5–5.1)
Sodium: 138 mmol/L (ref 135–145)
TCO2: 25 mmol/L (ref 22–32)

## 2018-12-30 LAB — COMPREHENSIVE METABOLIC PANEL
ALT: 22 U/L (ref 0–44)
AST: 31 U/L (ref 15–41)
Albumin: 3.7 g/dL (ref 3.5–5.0)
Alkaline Phosphatase: 47 U/L (ref 38–126)
Anion gap: 12 (ref 5–15)
BUN: 18 mg/dL (ref 8–23)
CO2: 21 mmol/L — ABNORMAL LOW (ref 22–32)
Calcium: 9.2 mg/dL (ref 8.9–10.3)
Chloride: 106 mmol/L (ref 98–111)
Creatinine, Ser: 0.89 mg/dL (ref 0.44–1.00)
GFR calc Af Amer: >60 mL/min (ref >60)
GFR calc non Af Amer: >60 mL/min (ref >60)
Glucose, Bld: 108 mg/dL — ABNORMAL HIGH (ref 70–99)
Potassium: 3.7 mmol/L (ref 3.5–5.1)
Sodium: 139 mmol/L (ref 135–145)
Total Bilirubin: 0.9 mg/dL (ref 0.3–1.2)
Total Protein: 6.8 g/dL (ref 6.5–8.1)

## 2018-12-30 LAB — CBC
HCT: 42.1 % (ref 36.0–46.0)
Hemoglobin: 13.9 g/dL (ref 12.0–15.0)
MCH: 30.1 pg (ref 26.0–34.0)
MCHC: 33 g/dL (ref 30.0–36.0)
MCV: 91.1 fL (ref 80.0–100.0)
Platelets: 307 10*3/uL (ref 150–400)
RBC: 4.62 MIL/uL (ref 3.87–5.11)
RDW: 13.4 % (ref 11.5–15.5)
WBC: 17 10*3/uL — ABNORMAL HIGH (ref 4.0–10.5)
nRBC: 0 % (ref 0.0–0.2)

## 2018-12-30 MED ORDER — ALPRAZOLAM 0.25 MG PO TABS
0.2500 mg | ORAL_TABLET | Freq: Every day | ORAL | Status: DC | PRN
  Administered 2019-01-02: 0.25 mg via ORAL
  Filled 2018-12-30: qty 1

## 2018-12-30 MED ORDER — HYDROCHLOROTHIAZIDE 12.5 MG PO CAPS
12.5000 mg | ORAL_CAPSULE | Freq: Every day | ORAL | Status: DC
  Administered 2018-12-31 – 2019-01-02 (×3): 12.5 mg via ORAL
  Filled 2018-12-30 (×3): qty 1

## 2018-12-30 MED ORDER — METOPROLOL TARTRATE 50 MG PO TABS
100.0000 mg | ORAL_TABLET | Freq: Two times a day (BID) | ORAL | Status: DC
  Administered 2018-12-31 – 2019-01-02 (×5): 100 mg via ORAL
  Filled 2018-12-30 (×5): qty 2

## 2018-12-30 MED ORDER — DOCUSATE SODIUM 100 MG PO CAPS
100.0000 mg | ORAL_CAPSULE | Freq: Two times a day (BID) | ORAL | Status: DC
  Administered 2018-12-31 – 2019-01-02 (×5): 100 mg via ORAL
  Filled 2018-12-30 (×5): qty 1

## 2018-12-30 MED ORDER — BISACODYL 10 MG RE SUPP: 10.0000 mg | Freq: Every day | RECTAL | Status: DC | PRN

## 2018-12-30 MED ORDER — ACETAMINOPHEN 500 MG PO TABS
1000.0000 mg | ORAL_TABLET | Freq: Four times a day (QID) | ORAL | Status: DC
  Administered 2018-12-30 – 2019-01-02 (×9): 1000 mg via ORAL
  Filled 2018-12-30 (×9): qty 2

## 2018-12-30 MED ORDER — ONDANSETRON HCL 4 MG/2ML IJ SOLN: 4.0000 mg | Freq: Four times a day (QID) | INTRAMUSCULAR | Status: DC | PRN

## 2018-12-30 MED ORDER — SIMVASTATIN 20 MG PO TABS: 20.0000 mg | ORAL_TABLET | Freq: Every evening | ORAL | Status: DC

## 2018-12-30 MED ORDER — ONDANSETRON 4 MG PO TBDP: 4.0000 mg | ORAL_TABLET | Freq: Four times a day (QID) | ORAL | Status: DC | PRN

## 2018-12-30 MED ORDER — IOHEXOL 300 MG/ML  SOLN
100.0000 mL | Freq: Once | INTRAMUSCULAR | Status: AC | PRN
  Administered 2018-12-30: 100 mL via INTRAVENOUS

## 2018-12-30 MED ORDER — ENOXAPARIN SODIUM 40 MG/0.4ML ~~LOC~~ SOLN
40.0000 mg | SUBCUTANEOUS | Status: DC
  Administered 2018-12-31 – 2019-01-02 (×3): 40 mg via SUBCUTANEOUS
  Filled 2018-12-30 (×3): qty 0.4

## 2018-12-30 MED ORDER — ONDANSETRON HCL 4 MG/2ML IJ SOLN
4.0000 mg | Freq: Once | INTRAMUSCULAR | Status: AC
  Administered 2018-12-30: 4 mg via INTRAVENOUS
  Filled 2018-12-30: qty 2

## 2018-12-30 MED ORDER — SODIUM CHLORIDE 0.9 % IV SOLN
INTRAVENOUS | Status: DC
  Administered 2018-12-30 – 2019-01-01 (×2): via INTRAVENOUS

## 2018-12-30 MED ORDER — METOPROLOL TARTRATE 25 MG PO TABS
100.0000 mg | ORAL_TABLET | Freq: Once | ORAL | Status: AC
  Administered 2018-12-30: 100 mg via ORAL
  Filled 2018-12-30: qty 4

## 2018-12-30 MED ORDER — TRAMADOL HCL 50 MG PO TABS
50.0000 mg | ORAL_TABLET | Freq: Four times a day (QID) | ORAL | Status: DC | PRN
  Filled 2018-12-30: qty 1

## 2018-12-30 MED ORDER — METOPROLOL TARTRATE 5 MG/5ML IV SOLN: 5.0000 mg | Freq: Four times a day (QID) | INTRAVENOUS | Status: DC | PRN

## 2018-12-30 MED ORDER — SIMVASTATIN 20 MG PO TABS
20.0000 mg | ORAL_TABLET | Freq: Every day | ORAL | Status: DC
  Administered 2018-12-30 – 2019-01-01 (×3): 20 mg via ORAL
  Filled 2018-12-30 (×3): qty 1

## 2018-12-30 MED ORDER — FENTANYL CITRATE (PF) 100 MCG/2ML IJ SOLN
25.0000 ug | Freq: Once | INTRAMUSCULAR | Status: AC
  Administered 2018-12-30: 25 ug via INTRAVENOUS
  Filled 2018-12-30: qty 2

## 2018-12-30 MED ORDER — SODIUM CHLORIDE 0.9 % IV SOLN
INTRAVENOUS | Status: DC
  Administered 2018-12-30: 21:00:00 via INTRAVENOUS

## 2018-12-30 MED ORDER — MORPHINE SULFATE (PF) 2 MG/ML IV SOLN
2.0000 mg | Freq: Once | INTRAVENOUS | Status: AC
  Administered 2018-12-30: 2 mg via INTRAVENOUS
  Filled 2018-12-30: qty 1

## 2018-12-30 MED ORDER — MORPHINE SULFATE (PF) 2 MG/ML IV SOLN
1.0000 mg | INTRAVENOUS | Status: DC | PRN
  Administered 2018-12-30 – 2019-01-01 (×4): 1 mg via INTRAVENOUS
  Filled 2018-12-30 (×4): qty 1

## 2018-12-30 MED ORDER — METHOCARBAMOL 500 MG PO TABS
500.0000 mg | ORAL_TABLET | Freq: Three times a day (TID) | ORAL | Status: DC | PRN
  Administered 2018-12-31: 500 mg via ORAL
  Filled 2018-12-30: qty 1

## 2018-12-30 MED ORDER — OXYCODONE HCL 5 MG PO TABS
5.0000 mg | ORAL_TABLET | ORAL | Status: DC | PRN
  Administered 2018-12-31 – 2019-01-01 (×3): 5 mg via ORAL
  Filled 2018-12-30 (×3): qty 1
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
## 2018-12-30 NOTE — Plan of Care (Signed)
  Problem: Nutrition: Goal: Adequate nutrition will be maintained Outcome: Completed/Met   Problem: Coping: Goal: Level of anxiety will decrease Outcome: Completed/Met   Problem: Elimination: Goal: Will not experience complications related to bowel motility Outcome: Completed/Met   Problem: Skin Integrity: Goal: Risk for impaired skin integrity will decrease Outcome: Completed/Met
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
## 2018-12-30 NOTE — ED Notes (Signed)
ED Provider at bedside. 

## 2018-12-31 LAB — BASIC METABOLIC PANEL
Anion gap: 9 (ref 5–15)
BUN: 13 mg/dL (ref 8–23)
CO2: 27 mmol/L (ref 22–32)
Calcium: 8.7 mg/dL — ABNORMAL LOW (ref 8.9–10.3)
Chloride: 103 mmol/L (ref 98–111)
Creatinine, Ser: 0.82 mg/dL (ref 0.44–1.00)
GFR calc Af Amer: >60 mL/min (ref >60)
GFR calc non Af Amer: >60 mL/min (ref >60)
Glucose, Bld: 105 mg/dL — ABNORMAL HIGH (ref 70–99)
Potassium: 3.8 mmol/L (ref 3.5–5.1)
Sodium: 139 mmol/L (ref 135–145)

## 2018-12-31 LAB — CBC
HCT: 34.7 % — ABNORMAL LOW (ref 36.0–46.0)
Hemoglobin: 11.6 g/dL — ABNORMAL LOW (ref 12.0–15.0)
MCH: 30.1 pg (ref 26.0–34.0)
MCHC: 33.4 g/dL (ref 30.0–36.0)
MCV: 89.9 fL (ref 80.0–100.0)
Platelets: 272 10*3/uL (ref 150–400)
RBC: 3.86 MIL/uL — ABNORMAL LOW (ref 3.87–5.11)
RDW: 13.3 % (ref 11.5–15.5)
WBC: 10.4 10*3/uL (ref 4.0–10.5)
nRBC: 0 % (ref 0.0–0.2)

## 2018-12-31 LAB — SARS CORONAVIRUS 2 (TAT 6-24 HRS): SARS Coronavirus 2: NEGATIVE
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
## 2019-01-01 ENCOUNTER — Inpatient Hospital Stay (HOSPITAL_COMMUNITY): Payer: Medicare Other

## 2019-01-01 LAB — BASIC METABOLIC PANEL
Anion gap: 10 (ref 5–15)
BUN: 10 mg/dL (ref 8–23)
CO2: 25 mmol/L (ref 22–32)
Calcium: 8.9 mg/dL (ref 8.9–10.3)
Chloride: 103 mmol/L (ref 98–111)
Creatinine, Ser: 0.68 mg/dL (ref 0.44–1.00)
GFR calc Af Amer: >60 mL/min (ref >60)
GFR calc non Af Amer: >60 mL/min (ref >60)
Glucose, Bld: 102 mg/dL — ABNORMAL HIGH (ref 70–99)
Potassium: 3.7 mmol/L (ref 3.5–5.1)
Sodium: 138 mmol/L (ref 135–145)

## 2019-01-01 LAB — CBC
HCT: 35.4 % — ABNORMAL LOW (ref 36.0–46.0)
Hemoglobin: 12.2 g/dL (ref 12.0–15.0)
MCH: 30.6 pg (ref 26.0–34.0)
MCHC: 34.5 g/dL (ref 30.0–36.0)
MCV: 88.7 fL (ref 80.0–100.0)
Platelets: 257 10*3/uL (ref 150–400)
RBC: 3.99 MIL/uL (ref 3.87–5.11)
RDW: 13.1 % (ref 11.5–15.5)
WBC: 8.1 10*3/uL (ref 4.0–10.5)
nRBC: 0 % (ref 0.0–0.2)

## 2019-01-01 LAB — MAGNESIUM: Magnesium: 1.7 mg/dL (ref 1.7–2.4)

## 2019-01-01 LAB — PHOSPHORUS: Phosphorus: 4.1 mg/dL (ref 2.5–4.6)

## 2019-01-01 MED ORDER — OXYCODONE HCL 5 MG PO TABS
5.0000 mg | ORAL_TABLET | ORAL | Status: DC | PRN
  Administered 2019-01-01: 10 mg via ORAL
  Administered 2019-01-01 – 2019-01-02 (×2): 5 mg via ORAL
  Filled 2019-01-01: qty 2
  Filled 2019-01-01 (×2): qty 1

## 2019-01-01 MED ORDER — MORPHINE SULFATE (PF) 2 MG/ML IV SOLN: 1.0000 mg | INTRAVENOUS | Status: DC | PRN

## 2019-01-01 MED ORDER — METHOCARBAMOL 500 MG PO TABS
500.0000 mg | ORAL_TABLET | Freq: Three times a day (TID) | ORAL | Status: DC
  Administered 2019-01-01 – 2019-01-02 (×3): 500 mg via ORAL
  Filled 2019-01-01 (×3): qty 1

## 2019-01-01 NOTE — Discharge Instructions (Addendum)
RIB FRACTURES  HOME INSTRUCTIONS   1. PAIN CONTROL:  1. Pain is best controlled by a usual combination of three different methods TOGETHER:  i. Ice/Heat ii. Over the counter pain medication iii. Prescription pain medication 2. You may experience some swelling and bruising in area of broken ribs. Ice packs or heating pads (30-60 minutes up to 6 times a day) will help. Use ice for the first few days to help decrease swelling and bruising, then switch to heat to help relax tight/sore spots and speed recovery. Some people prefer to use ice alone, heat alone, alternating between ice & heat. Experiment to what works for you. Swelling and bruising can take several weeks to resolve.  3. It is helpful to take an over-the-counter pain medication regularly for the first few weeks. Choose one of the following that works best for you:  i. Naproxen (Aleve, etc) Two 220mg  tabs twice a day ii. Ibuprofen (Advil, etc) Three 200mg  tabs four times a day (every meal & bedtime) iii. Acetaminophen (Tylenol, etc) 500-650mg  four times a day (every meal & bedtime) 4. A prescription for pain medication (such as oxycodone, hydrocodone, etc) may be given to you upon discharge. Take your pain medication as prescribed.  i. If you are having problems/concerns with the prescription medicine (does not control pain, nausea, vomiting, rash, itching, etc), please call us [phone] to see if we need to switch you to a different pain medicine that will work better for you and/or control your side effect better. ii. If you need a refill on your pain medication, please contact your pharmacy. They will contact our office to request authorization. Prescriptions will not be filled after 5 pm or on week-ends. 1. Avoid getting constipated. When taking pain medications, it is common to experience some constipation. Increasing fluid intake and taking a fiber supplement (such as Metamucil, Citrucel, FiberCon, MiraLax, etc) 1-2 times a day  regularly will usually help prevent this problem from occurring. A mild laxative (prune juice, Milk of Magnesia, MiraLax, etc) should be taken according to package directions if there are no bowel movements after 48 hours.  2. Watch out for diarrhea. If you have many loose bowel movements, simplify your diet to bland foods & liquids for a few days. Stop any stool softeners and decrease your fiber supplement. Switching to mild anti-diarrheal medications (Kayopectate, Pepto Bismol) can help. If this worsens or does not improve, please call us.  WHEN TO CALL us [phone]:  1. Poor pain control 2. Reactions / problems with new medications (rash/itching, nausea, etc)  3. Fever over 101.5 F (38.5 C) 4. Worsening swelling or bruising 5. Redness, drainage, pain or swelling around chest tube site 6. Worsening pain, productive cough, difficulty breathing or any other concerning symptoms  The clinic staff is available to answer your questions during regular business hours (8:30am-5pm). Please don't hesitate to call and ask to speak to one of our nurses for clinical concerns.  If you have a medical emergency, go to the nearest emergency room or call 911.  A surgeon from [facility] is always on call at the [facility], [place], [place], [place], [place] [address] ?  MAIN: [phone] ? TOLL FREE: [phone] ?  FAX [phone]  [email or web address]      Information on Rib Fractures  A rib fracture is a break or crack in one of the bones of the ribs. The ribs are long, curved bones  that wrap around your chest and attach to your spine and your breastbone. The ribs protect your heart, lungs, and other organs in the chest. A broken or cracked rib is often painful but is not usually serious. Most rib fractures heal on their own over time. However, rib fractures can be more serious if multiple ribs are broken or if broken ribs  move out of place and push against other structures or organs. What are the causes? This condition is caused by:  Repetitive movements with high force, such as pitching a baseball or having severe coughing spells.  A direct blow to the chest, such as a sports injury, a car accident, or a fall.  Cancer that has spread to the bones, which can weaken bones and cause them to break. What are the signs or symptoms? Symptoms of this condition include:  Pain when you breathe in or cough.  Pain when someone presses on the injured area.  Feeling short of breath. How is this diagnosed? This condition is diagnosed with a physical exam and medical history. Imaging tests may also be done, such as:  Chest X-ray.  CT scan.  MRI.  Bone scan.  Chest ultrasound. How is this treated? Treatment for this condition depends on the severity of the fracture. Most rib fractures usually heal on their own in 1-3 months. Sometimes healing takes longer if there is a cough that does not stop or if there are other activities that make the injury worse (aggravating factors). While you heal, you will be given medicines to control the pain. You will also be taught deep breathing exercises. Severe injuries may require hospitalization or surgery. Follow these instructions at home: Managing pain, stiffness, and swelling  If directed, apply ice to the injured area. ? Put ice in a plastic bag. ? Place a towel between your skin and the bag. ? Leave the ice on for 20 minutes, 2-3 times a day.  Take over-the-counter and prescription medicines only as told by your health care provider. Activity  Avoid a lot of activity and any activities or movements that cause pain. Be careful during activities and avoid bumping the injured rib.  Slowly increase your activity as told by your health care provider. General instructions  Do deep breathing exercises as told by your health care provider. This helps prevent pneumonia,  which is a common complication of a broken rib. Your health care provider may instruct you to: ? Take deep breaths several times a day. ? Try to cough several times a day, holding a pillow against the injured area. ? Use a device called incentive spirometer to practice deep breathing several times a day.  Drink enough fluid to keep your urine pale yellow.  Do not wear a rib belt or binder. These restrict breathing, which can lead to pneumonia.  Keep all follow-up visits as told by your health care provider. This is important. Contact a health care provider if:  You have a fever. Get help right away if:  You have difficulty breathing or you are short of breath.  You develop a cough that does not stop, or you cough up thick or bloody sputum.  You have nausea, vomiting, or pain in your abdomen.  Your pain gets worse and medicine does not help. Summary  A rib fracture is a break or crack in one of the bones of the ribs.  A broken or cracked rib is often painful but is not usually serious.  Most rib fractures heal  on their own over time.  Treatment for this condition depends on the severity of the fracture.  Avoid a lot of activity and any activities or movements that cause pain. This information is not intended to replace advice given to you by your health care provider. Make sure you discuss any questions you have with your health care provider. Document Released: 01/04/2005 Document Revised: 04/05/2016 Document Reviewed: 04/05/2016 Elsevier Interactive Patient Education  2019 [employer].
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
## 2019-01-02 MED ORDER — POLYETHYLENE GLYCOL 3350 17 G PO PACK: 17.0000 g | PACK | Freq: Every day | ORAL | 0 refills | Status: AC

## 2019-01-02 MED ORDER — POLYETHYLENE GLYCOL 3350 17 G PO PACK
17.0000 g | PACK | Freq: Every day | ORAL | Status: DC
  Administered 2019-01-02: 17 g via ORAL
  Filled 2019-01-02: qty 1

## 2019-01-02 MED ORDER — DOCUSATE SODIUM 100 MG PO CAPS: 100.0000 mg | ORAL_CAPSULE | Freq: Two times a day (BID) | ORAL | 0 refills | Status: AC

## 2019-01-02 MED ORDER — METHOCARBAMOL 500 MG PO TABS: 500.0000 mg | ORAL_TABLET | Freq: Three times a day (TID) | ORAL | 0 refills | Status: AC | PRN

## 2019-01-02 MED ORDER — OXYCODONE HCL 5 MG PO TABS: 5.0000 mg | ORAL_TABLET | Freq: Four times a day (QID) | ORAL | 0 refills | Status: DC | PRN

## 2019-01-02 MED ORDER — ACETAMINOPHEN 500 MG PO TABS: 1000.0000 mg | ORAL_TABLET | Freq: Four times a day (QID) | ORAL | 0 refills | Status: AC | PRN

## 2019-01-02 MED FILL — oxyCODONE HCL 5 MG TABS: 5 | 7 days supply | Qty: 30 | Fill #0

## 2019-01-02 MED FILL — METHOCARBAMOL 500 MG TABS: 500 | 10 days supply | Qty: 30 | Fill #0
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
## 2019-01-02 NOTE — Progress Notes (Signed)
RN gave pt discharge instructions and removed her IV, pt stated understanding and had no further questions. TOC dropped off pt prescriptions.
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
## 2019-01-02 NOTE — Progress Notes (Signed)
Pt concerned about coughing with tan frothy sputum. Sputum collected in specimen cup. Reported to next shift nurse to show sputum to provider.

## 2019-01-27 ENCOUNTER — Other Ambulatory Visit: Payer: Self-pay | Admitting: Cardiology

## 2019-01-29 NOTE — Telephone Encounter (Signed)
Prescription refill request for Xarelto received.   Last office visit: 06/02/2018, Turner Weight: 75.8 kg  Age: 79 y.o. Scr: 0.68 01/01/2019 CrCl: 84 ml/min   Prescription refill sent.

## 2019-05-25 ENCOUNTER — Other Ambulatory Visit: Payer: Self-pay | Admitting: Cardiology

## 2019-05-28 NOTE — Telephone Encounter (Signed)
Prescription refill request for Xarelto received.   Last office visit: Teresa White, 06/02/2018 Weight: 75.8 kg Age: 79 y.o. Scr: 0.68, 01/01/2019 CrCl: 84.22 ml/min    Prescription refill sent.

## 2019-11-30 ENCOUNTER — Encounter: Payer: Self-pay | Admitting: Cardiology

## 2019-11-30 ENCOUNTER — Other Ambulatory Visit: Payer: Self-pay

## 2019-11-30 ENCOUNTER — Ambulatory Visit: Payer: Medicare Other | Admitting: Cardiology

## 2019-11-30 VITALS — BP 140/84 | HR 55 | Ht 64.0 in | Wt 152.0 lb

## 2019-11-30 DIAGNOSIS — I4819 Other persistent atrial fibrillation: Secondary | ICD-10-CM

## 2019-11-30 DIAGNOSIS — I10 Essential (primary) hypertension: Secondary | ICD-10-CM

## 2019-11-30 NOTE — Patient Instructions (Signed)

## 2019-11-30 NOTE — Progress Notes (Signed)
Date:  11/30/2019   ID:  Teresa White, DOB 09/08/1940, MRN [id number]   PCP:  Renford Dills, MD  Cardiologist:  Armanda Magic, MD  Electrophysiologist:  None   Chief Complaint:  Atrial fibrillation and HTN  History of Present Illness:    Teresa White is a 79 y.o. female  with a hx of persistentatrial fibrillation and HTN.  She is here today for followup and is doing well.  She denies any chest pain or pressure, SOB, DOE, PND, orthopnea, LE edema, dizziness or syncope. Rarely she will have some palpitations but not bothersome.  She is compliant with her meds and is tolerating meds with no SE.    Prior CV studies:   The following studies were reviewed today:  EKG and outside labs from PCP  Past Medical History:  Diagnosis Date  . Anxiety   . Back pain    history of abnormal MRI mild spinal stenosis at L3-4 and L4-5 small central disc herniation at L2-3 small L sided disc and Herniation at L1  . Depression   . Hypercholesteremia   . Hypertension   . Obesity (BMI 30-39.9) 10/16/2015  . PAF (paroxysmal atrial fibrillation) (HCC)   . PVC (premature ventricular contraction)    Past Surgical History:  Procedure Laterality Date  . ABDOMINAL SURGERY       Current Meds  Medication Sig  . acetaminophen (TYLENOL) 500 MG tablet Take 2 tablets (1,000 mg total) by mouth every 6 (six) hours as needed for mild pain.  Marland Kitchen ALPRAZolam (XANAX) 0.25 MG tablet Take 0.25 mg by mouth as needed for anxiety.   . docusate sodium (COLACE) 100 MG capsule Take 1 capsule (100 mg total) by mouth 2 (two) times daily.  . hydrochlorothiazide (HYDRODIURIL) 12.5 MG tablet Take 12.5 mg by mouth every morning.   . methocarbamol (ROBAXIN) 500 MG tablet Take 1 tablet (500 mg total) by mouth every 8 (eight) hours as needed for muscle spasms.  . metoprolol (LOPRESSOR) 100 MG tablet Take 100 mg by mouth 2 (two) times daily.  Marland Kitchen oxyCODONE (OXY IR/ROXICODONE) 5 MG immediate release tablet Take 1 tablet (5  mg total) by mouth every 6 (six) hours as needed for severe pain.  . polyethylene glycol (MIRALAX / GLYCOLAX) 17 g packet Take 17 g by mouth daily.  . sertraline (ZOLOFT) 25 MG tablet Take 25 mg by mouth daily.  . simethicone (MYLICON) 125 MG chewable tablet Chew 125 mg by mouth daily as needed for flatulence. For gas  . simvastatin (ZOCOR) 20 MG tablet Take 20 mg by mouth every evening.  Marland Kitchen VITAMIN D PO Take 1 tablet by mouth daily.   [name] 20 MG TABS tablet TAKE 1 TABLET BY MOUTH EVERY DAY     Allergies:   Azithromycin, Demerol, Erythromycin, Demerol  [meperidine hcl], Flonase [fluticasone propionate], Meperidine, and Vicodin [hydrocodone-acetaminophen]   Social History   Tobacco Use  . Smoking status: Former Smoker    Types: Cigarettes    Quit date: 01/18/1998    Years since quitting: 21.8  . Smokeless tobacco: Never Used  Substance Use Topics  . Alcohol use: No    Alcohol/week: 0.0 standard drinks  . Drug use: No     Family Hx: The patient's family history includes Diabetes in her brother and sister; Kidney disease in her mother; Other in her father.  ROS:   Please see the history of present illness.     All other systems reviewed and are negative.  Labs/Other Tests and Data Reviewed:    Recent Labs: 12/30/2018: ALT 22 01/01/2019: BUN 10; Creatinine, Ser 0.68; Hemoglobin 12.2; Magnesium 1.7; Platelets 257; Potassium 3.7; Sodium 138   Recent Lipid Panel No results found for: CHOL, TRIG, HDL, CHOLHDL, LDLCALC, LDLDIRECT  Wt Readings from Last 3 Encounters:  11/30/19 152 lb (68.9 kg)  12/30/18 167 lb 1.7 oz (75.8 kg)  06/02/18 167 lb (75.8 kg)     Objective:    Vital Signs:  BP 140/84   Pulse (!) 55   Ht 5\' 4"  (1.626 m)   Wt 152 lb (68.9 kg)   BMI 26.09 kg/m    GEN: Well nourished, well developed in no acute distress HEENT: Normal NECK: No JVD; No carotid bruits LYMPHATICS: No lymphadenopathy CARDIAC:RRR, no murmurs, rubs, gallops RESPIRATORY:  Clear  to auscultation without rales, wheezing or rhonchi  ABDOMEN: Soft, non-tender, non-distended MUSCULOSKELETAL:  No edema; No deformity  SKIN: Warm and dry NEUROLOGIC:  Alert and oriented x 3 PSYCHIATRIC:  Normal affect   EKG was done in the office today and showed sinus bradycardia at 55bpm with nonspecific ST abnormality  ASSESSMENT & PLAN:    1.  Persistent atrial fibrillation  -she is maintaining NSR on exam today and has not had any palpitations -continue on Lopressor 100mg  BID and Xarelto 20mg  daily for CHADS2VASC score of 4.   -denies any bleeding problems on DOAC -CrCl in Sept was 73cc/min   2.  Hypertension  -BP controlled on exam today -continue on HCTZ 12.5mg  daily and Lopressor 100mg  BID -SCr 0.7 in Sept  Medication Adjustments/Labs and Tests Ordered: Current medicines are reviewed at length with the patient today.  Concerns regarding medicines are outlined above.  Tests Ordered: Orders Placed This Encounter  Procedures  . EKG 12-Lead   Medication Changes: No orders of the defined types were placed in this encounter.   Disposition:  Follow up in 6 month(s)  Signed, , MD  11/30/2019 10:37 AM    [facility]

## 2019-12-26 ENCOUNTER — Other Ambulatory Visit: Payer: Self-pay | Admitting: Cardiology

## 2019-12-26 NOTE — Telephone Encounter (Signed)
Prescription refill request for Xarelto received.  Indication:afib Last office visit:Turner, 11/30/2019 Weight: 68.9 kg  Age: 79 yo Scr: 0.7, 10/04/2019 CrCl: 98 ml/min   Prescription refill sent.

## 2020-01-30 DIAGNOSIS — I10 Essential (primary) hypertension: Secondary | ICD-10-CM | POA: Diagnosis not present

## 2020-01-30 DIAGNOSIS — I480 Paroxysmal atrial fibrillation: Secondary | ICD-10-CM | POA: Diagnosis not present

## 2020-01-30 DIAGNOSIS — G4700 Insomnia, unspecified: Secondary | ICD-10-CM | POA: Diagnosis not present

## 2020-01-30 DIAGNOSIS — M810 Age-related osteoporosis without current pathological fracture: Secondary | ICD-10-CM | POA: Diagnosis not present

## 2020-01-30 DIAGNOSIS — I4891 Unspecified atrial fibrillation: Secondary | ICD-10-CM | POA: Diagnosis not present

## 2020-01-30 DIAGNOSIS — E7800 Pure hypercholesterolemia, unspecified: Secondary | ICD-10-CM | POA: Diagnosis not present

## 2020-01-30 DIAGNOSIS — E782 Mixed hyperlipidemia: Secondary | ICD-10-CM | POA: Diagnosis not present

## 2020-02-01 DIAGNOSIS — I10 Essential (primary) hypertension: Secondary | ICD-10-CM | POA: Diagnosis not present

## 2020-02-28 DIAGNOSIS — I10 Essential (primary) hypertension: Secondary | ICD-10-CM | POA: Diagnosis not present

## 2020-03-14 DIAGNOSIS — I10 Essential (primary) hypertension: Secondary | ICD-10-CM | POA: Diagnosis not present

## 2020-03-24 DIAGNOSIS — I10 Essential (primary) hypertension: Secondary | ICD-10-CM | POA: Diagnosis not present

## 2020-04-02 DIAGNOSIS — I10 Essential (primary) hypertension: Secondary | ICD-10-CM | POA: Diagnosis not present

## 2020-04-02 DIAGNOSIS — I480 Paroxysmal atrial fibrillation: Secondary | ICD-10-CM | POA: Diagnosis not present

## 2020-04-02 DIAGNOSIS — Z1389 Encounter for screening for other disorder: Secondary | ICD-10-CM | POA: Diagnosis not present

## 2020-04-02 DIAGNOSIS — M810 Age-related osteoporosis without current pathological fracture: Secondary | ICD-10-CM | POA: Diagnosis not present

## 2020-04-02 DIAGNOSIS — Z0000 Encounter for general adult medical examination without abnormal findings: Secondary | ICD-10-CM | POA: Diagnosis not present

## 2020-04-02 DIAGNOSIS — I77810 Thoracic aortic ectasia: Secondary | ICD-10-CM | POA: Diagnosis not present

## 2020-04-02 DIAGNOSIS — D6869 Other thrombophilia: Secondary | ICD-10-CM | POA: Diagnosis not present

## 2020-04-02 DIAGNOSIS — E7800 Pure hypercholesterolemia, unspecified: Secondary | ICD-10-CM | POA: Diagnosis not present

## 2020-04-24 DIAGNOSIS — I480 Paroxysmal atrial fibrillation: Secondary | ICD-10-CM | POA: Diagnosis not present

## 2020-04-24 DIAGNOSIS — I10 Essential (primary) hypertension: Secondary | ICD-10-CM | POA: Diagnosis not present

## 2020-04-24 DIAGNOSIS — E7800 Pure hypercholesterolemia, unspecified: Secondary | ICD-10-CM | POA: Diagnosis not present

## 2020-04-24 DIAGNOSIS — E782 Mixed hyperlipidemia: Secondary | ICD-10-CM | POA: Diagnosis not present

## 2020-04-24 DIAGNOSIS — G4700 Insomnia, unspecified: Secondary | ICD-10-CM | POA: Diagnosis not present

## 2020-04-24 DIAGNOSIS — M810 Age-related osteoporosis without current pathological fracture: Secondary | ICD-10-CM | POA: Diagnosis not present

## 2020-05-31 ENCOUNTER — Other Ambulatory Visit: Payer: Self-pay | Admitting: Cardiology

## 2020-06-02 NOTE — Telephone Encounter (Signed)
Xarelto 20mg  refill request received. Pt is 80 years old, weight-68.9kg, Crea-0.73 on 04/02/20 via [facility] form [facility], last seen by 04/04/20 on 11/30/19, Diagnosis-Afib, CrCl-82.15ml/min; Dose is appropriate based on dosing criteria. Will send in refill to requested pharmacy.

## 2020-07-17 DIAGNOSIS — E7800 Pure hypercholesterolemia, unspecified: Secondary | ICD-10-CM | POA: Diagnosis not present

## 2020-07-17 DIAGNOSIS — G4700 Insomnia, unspecified: Secondary | ICD-10-CM | POA: Diagnosis not present

## 2020-07-17 DIAGNOSIS — I10 Essential (primary) hypertension: Secondary | ICD-10-CM | POA: Diagnosis not present

## 2020-07-17 DIAGNOSIS — I4891 Unspecified atrial fibrillation: Secondary | ICD-10-CM | POA: Diagnosis not present

## 2020-07-17 DIAGNOSIS — E782 Mixed hyperlipidemia: Secondary | ICD-10-CM | POA: Diagnosis not present

## 2020-07-17 DIAGNOSIS — I480 Paroxysmal atrial fibrillation: Secondary | ICD-10-CM | POA: Diagnosis not present

## 2020-07-17 DIAGNOSIS — M810 Age-related osteoporosis without current pathological fracture: Secondary | ICD-10-CM | POA: Diagnosis not present

## 2020-07-30 DIAGNOSIS — M810 Age-related osteoporosis without current pathological fracture: Secondary | ICD-10-CM | POA: Diagnosis not present

## 2020-07-30 DIAGNOSIS — E782 Mixed hyperlipidemia: Secondary | ICD-10-CM | POA: Diagnosis not present

## 2020-07-30 DIAGNOSIS — I480 Paroxysmal atrial fibrillation: Secondary | ICD-10-CM | POA: Diagnosis not present

## 2020-07-30 DIAGNOSIS — G4700 Insomnia, unspecified: Secondary | ICD-10-CM | POA: Diagnosis not present

## 2020-07-30 DIAGNOSIS — I10 Essential (primary) hypertension: Secondary | ICD-10-CM | POA: Diagnosis not present

## 2020-07-30 DIAGNOSIS — E7800 Pure hypercholesterolemia, unspecified: Secondary | ICD-10-CM | POA: Diagnosis not present

## 2020-10-09 DIAGNOSIS — D6869 Other thrombophilia: Secondary | ICD-10-CM | POA: Diagnosis not present

## 2020-10-09 DIAGNOSIS — I4891 Unspecified atrial fibrillation: Secondary | ICD-10-CM | POA: Diagnosis not present

## 2020-10-09 DIAGNOSIS — Z23 Encounter for immunization: Secondary | ICD-10-CM | POA: Diagnosis not present

## 2020-10-09 DIAGNOSIS — I77810 Thoracic aortic ectasia: Secondary | ICD-10-CM | POA: Diagnosis not present

## 2020-10-09 DIAGNOSIS — R5383 Other fatigue: Secondary | ICD-10-CM | POA: Diagnosis not present

## 2020-10-09 DIAGNOSIS — M810 Age-related osteoporosis without current pathological fracture: Secondary | ICD-10-CM | POA: Diagnosis not present

## 2020-10-09 DIAGNOSIS — I10 Essential (primary) hypertension: Secondary | ICD-10-CM | POA: Diagnosis not present

## 2020-10-09 DIAGNOSIS — E7800 Pure hypercholesterolemia, unspecified: Secondary | ICD-10-CM | POA: Diagnosis not present

## 2020-10-09 DIAGNOSIS — M2550 Pain in unspecified joint: Secondary | ICD-10-CM | POA: Diagnosis not present

## 2020-10-09 DIAGNOSIS — M791 Myalgia, unspecified site: Secondary | ICD-10-CM | POA: Diagnosis not present

## 2020-11-24 ENCOUNTER — Other Ambulatory Visit: Payer: Self-pay | Admitting: Cardiology

## 2020-11-24 NOTE — Telephone Encounter (Signed)
Xarelto 20 mg refill request received. Pt is 80 years old, weight- 68.9 kg, Crea- 0.8 on 10/09/20 via [facility], last seen by Dr. Mayford Knife on 11/30/19,- has appt 12/02/20,  Diagnosis-afib, CrCl- 63.04; Dose is appropriate based on dosing criteria. Will send in refill to requested pharmacy.

## 2020-12-02 ENCOUNTER — Ambulatory Visit (INDEPENDENT_AMBULATORY_CARE_PROVIDER_SITE_OTHER): Payer: Medicare Other | Admitting: Cardiology

## 2020-12-02 ENCOUNTER — Other Ambulatory Visit: Payer: Self-pay

## 2020-12-02 ENCOUNTER — Encounter: Payer: Self-pay | Admitting: Cardiology

## 2020-12-02 VITALS — BP 130/80 | HR 60 | Ht 64.0 in | Wt 149.0 lb

## 2020-12-02 DIAGNOSIS — I10 Essential (primary) hypertension: Secondary | ICD-10-CM | POA: Diagnosis not present

## 2020-12-02 DIAGNOSIS — I4819 Other persistent atrial fibrillation: Secondary | ICD-10-CM

## 2020-12-02 NOTE — Addendum Note (Signed)
Addended by: Theresia Majors on: 12/02/2020 02:40 PM   Modules accepted: Orders

## 2020-12-02 NOTE — Progress Notes (Signed)
Date:  11/15/White   ID:  Teresa White, DOB 03/22/1940, MRN [id number]   PCP:  Teresa Dills, MD  Cardiologist:  Teresa Magic, MD  Electrophysiologist:  None   Chief Complaint:  Atrial fibrillation and HTN  History of Present Illness:    Teresa White is a 80 y.o. female  with a hx of persistent atrial fibrillation and HTN.  She is here today for followup and is doing well.  She denies any chest pain or pressure, SOB, DOE, PND, orthopnea, LE edema, dizziness, palpitations or syncope. She is compliant with her meds and is tolerating meds with no SE.     Prior CV studies:   The following studies were reviewed today:  EKG and outside labs from PCP  Past Medical History:  Diagnosis Date   Anxiety    Back pain    history of abnormal MRI mild spinal stenosis at L3-4 and L4-5 small central disc herniation at L2-3 small L sided disc and Herniation at L1   Depression    Hypercholesteremia    Hypertension    Obesity (BMI 30-39.9) 10/16/2015   PAF (paroxysmal atrial fibrillation) (HCC)    PVC (premature ventricular contraction)    Past Surgical History:  Procedure Laterality Date   ABDOMINAL SURGERY       Current Meds  Medication Sig   acetaminophen (TYLENOL) 500 MG tablet Take 2 tablets (1,000 mg total) by mouth every 6 (six) hours as needed for mild pain.   ALPRAZolam (XANAX) 0.25 MG tablet Take 0.25 mg by mouth as needed for anxiety.    docusate sodium (COLACE) 100 MG capsule Take 1 capsule (100 mg total) by mouth 2 (two) times daily.   hydrochlorothiazide (HYDRODIURIL) 12.5 MG tablet Take 12.5 mg by mouth every morning.    methocarbamol (ROBAXIN) 500 MG tablet Take 1 tablet (500 mg total) by mouth every 8 (eight) hours as needed for muscle spasms.   metoprolol (LOPRESSOR) 100 MG tablet Take 100 mg by mouth 2 (two) times daily.   oxyCODONE (OXY IR/ROXICODONE) 5 MG immediate release tablet Take 1 tablet (5 mg total) by mouth every 6 (six) hours as needed for severe  pain.   polyethylene glycol (MIRALAX / GLYCOLAX) 17 g packet Take 17 g by mouth daily.   sertraline (ZOLOFT) 25 MG tablet Take 25 mg by mouth daily.   simethicone (MYLICON) 125 MG chewable tablet Chew 125 mg by mouth daily as needed for flatulence. For gas   simvastatin (ZOCOR) 20 MG tablet Take 20 mg by mouth every evening.   VITAMIN D PO Take 1 tablet by mouth daily.    XARELTO 20 MG TABS tablet TAKE 1 TABLET BY MOUTH EVERY DAY     Allergies:   Azithromycin, Demerol, Erythromycin, Demerol  [meperidine hcl], Flonase [fluticasone propionate], Meperidine, and Vicodin [hydrocodone-acetaminophen]   Social History   Tobacco Use   Smoking status: Former    Types: Cigarettes    Quit date: 01/18/1998    Years since quitting: 22.8   Smokeless tobacco: Never  Substance Use Topics   Alcohol use: No    Alcohol/week: 0.0 standard drinks   Drug use: No     Family Hx: The patient's family history includes Diabetes in her brother and sister; Kidney disease in her mother; Other in her father.  ROS:   Please see the history of present illness.     All other systems reviewed and are negative.   Labs/Other Tests and Data Reviewed:  Recent Labs: No results found for requested labs within last 8760 hours.   Recent Lipid Panel No results found for: CHOL, TRIG, HDL, CHOLHDL, LDLCALC, LDLDIRECT  Wt Readings from Last 3 Encounters:  12/02/20 149 lb (67.6 kg)  11/30/19 152 lb (68.9 kg)  12/30/18 167 lb 1.7 oz (75.8 kg)     Objective:    Vital Signs:  BP 130/80   Pulse 60   Ht 5\' 4"  (1.626 m)   Wt 149 lb (67.6 kg)   SpO2 100%   BMI 25.58 kg/m    GEN: Well nourished, well developed in no acute distress HEENT: Normal NECK: No JVD; No carotid bruits LYMPHATICS: No lymphadenopathy CARDIAC:RRR, no murmurs, rubs, gallops RESPIRATORY:  Clear to auscultation without rales, wheezing or rhonchi  ABDOMEN: Soft, non-tender, non-distended MUSCULOSKELETAL:  No edema; No deformity  SKIN: Warm  and dry NEUROLOGIC:  Alert and oriented x 3 PSYCHIATRIC:  Normal affect    EKG was done in the office today and showed NSR with nonspecific ST abnormality in the inferolateral leads  ASSESSMENT & PLAN:    1.  Persistent atrial fibrillation  -She is maintaining normal sinus rhythm on exam today  and denies any palpitations -Continue prescription drug management with Lopressor 100 mg twice daily and Xarelto 20 mg daily for CHA2DS2-VASc score of 4 with as needed refills  -She has not had any bleeding problems on DOAC -Hemoglobin stable at 13.4 on 9/22/White -Repeat bmet due to borderline creatinine clearance on Xarelto  2.  Hypertension  -BP is adequately controlled on exam today -Continue prescription drug management with HCTZ 12.5 mg daily and Lopressor 100 mg twice daily with as needed refills  -Serum creatinine clearance was 43.21 in Teresa 22, White -Repeat bmet  Medication Adjustments/Labs and Tests Ordered: Current medicines are reviewed at length with the patient today.  Concerns regarding medicines are outlined above.  Tests Ordered: No orders of the defined types were placed in this encounter.  Medication Changes: No orders of the defined types were placed in this encounter.   Disposition:  Follow up in 6 month(s)  Signed, Teresa 24, 2022, MD  11/15/White 2:25 PM    [facility]

## 2020-12-02 NOTE — Patient Instructions (Signed)
Medication Instructions:  Your physician recommends that you continue on your current medications as directed. Please refer to the Current Medication list given to you today.  *If you need a refill on your cardiac medications before your next appointment, please call your pharmacy*  Lab Work: TODAY: BMET If you have labs (blood work) drawn today and your tests are completely normal, you will receive your results only by: . MyChart Message (if you have MyChart) OR . A paper copy in the mail If you have any lab test that is abnormal or we need to change your treatment, we will call you to review the results.   Follow-Up: At [facility], you and your health needs are our priority.  As part of our continuing mission to provide you with exceptional heart care, we have created designated Provider Care Teams.  These Care Teams include your primary Cardiologist (physician) and Advanced Practice Providers (APPs -  Physician Assistants and Nurse Practitioners) who all work together to provide you with the care you need, when you need it.  Your next appointment:   1 year(s)  The format for your next appointment:   In Person  Provider:   Traci Turner, MD   

## 2020-12-03 ENCOUNTER — Telehealth: Payer: Self-pay

## 2020-12-03 DIAGNOSIS — I4819 Other persistent atrial fibrillation: Secondary | ICD-10-CM

## 2020-12-03 DIAGNOSIS — I10 Essential (primary) hypertension: Secondary | ICD-10-CM

## 2020-12-03 LAB — BASIC METABOLIC PANEL
BUN/Creatinine Ratio: 13 (ref 12–28)
BUN: 10 mg/dL (ref 8–27)
CO2: 23 mmol/L (ref 20–29)
Calcium: 9.1 mg/dL (ref 8.7–10.3)
Chloride: 102 mmol/L (ref 96–106)
Creatinine, Ser: 0.8 mg/dL (ref 0.57–1.00)
Glucose: 88 mg/dL (ref 70–99)
Potassium: 3.6 mmol/L (ref 3.5–5.2)
Sodium: 140 mmol/L (ref 134–144)
eGFR: 75 mL/min/{1.73_m2} (ref >59)

## 2020-12-03 MED ORDER — POTASSIUM CHLORIDE CRYS ER 20 MEQ PO TBCR: 20.0000 meq | EXTENDED_RELEASE_TABLET | Freq: Every day | ORAL | 3 refills | Status: DC

## 2020-12-03 NOTE — Telephone Encounter (Signed)
-----   Message from Quintella Reichert, MD sent at 12/03/2020 10:45 AM EST ----- Potassium borderline low normal.  Please add K-Dur 20 mEq daily and repeat bmet in 1 week

## 2020-12-03 NOTE — Telephone Encounter (Signed)
The patient has been notified of the result and verbalized understanding.  All questions (if any) were answered. Theresia Majors, RN 12/03/2020 12:08 PM  K dur has been sent in. Lab work has been scheduled.

## 2020-12-10 ENCOUNTER — Other Ambulatory Visit: Payer: Medicare Other | Admitting: *Deleted

## 2020-12-10 ENCOUNTER — Other Ambulatory Visit: Payer: Self-pay

## 2020-12-10 DIAGNOSIS — I4819 Other persistent atrial fibrillation: Secondary | ICD-10-CM | POA: Diagnosis not present

## 2020-12-10 DIAGNOSIS — I10 Essential (primary) hypertension: Secondary | ICD-10-CM | POA: Diagnosis not present

## 2020-12-10 LAB — BASIC METABOLIC PANEL
BUN/Creatinine Ratio: 13 (ref 12–28)
BUN: 9 mg/dL (ref 8–27)
CO2: 27 mmol/L (ref 20–29)
Calcium: 9.1 mg/dL (ref 8.7–10.3)
Chloride: 101 mmol/L (ref 96–106)
Creatinine, Ser: 0.71 mg/dL (ref 0.57–1.00)
Glucose: 81 mg/dL (ref 70–99)
Potassium: 3.8 mmol/L (ref 3.5–5.2)
Sodium: 139 mmol/L (ref 134–144)
eGFR: 87 mL/min/{1.73_m2} (ref >59)

## 2021-04-20 DIAGNOSIS — I480 Paroxysmal atrial fibrillation: Secondary | ICD-10-CM | POA: Diagnosis not present

## 2021-04-20 DIAGNOSIS — M810 Age-related osteoporosis without current pathological fracture: Secondary | ICD-10-CM | POA: Diagnosis not present

## 2021-04-20 DIAGNOSIS — D6869 Other thrombophilia: Secondary | ICD-10-CM | POA: Diagnosis not present

## 2021-04-20 DIAGNOSIS — Z0000 Encounter for general adult medical examination without abnormal findings: Secondary | ICD-10-CM | POA: Diagnosis not present

## 2021-04-20 DIAGNOSIS — Z1389 Encounter for screening for other disorder: Secondary | ICD-10-CM | POA: Diagnosis not present

## 2021-04-20 DIAGNOSIS — I10 Essential (primary) hypertension: Secondary | ICD-10-CM | POA: Diagnosis not present

## 2021-04-20 DIAGNOSIS — E7800 Pure hypercholesterolemia, unspecified: Secondary | ICD-10-CM | POA: Diagnosis not present

## 2021-05-07 ENCOUNTER — Other Ambulatory Visit: Payer: Self-pay | Admitting: Cardiology

## 2021-05-07 DIAGNOSIS — I4819 Other persistent atrial fibrillation: Secondary | ICD-10-CM

## 2021-05-07 NOTE — Telephone Encounter (Signed)
Prescription refill request for Xarelto received.  ?Indication: Afib  ?Last office visit: 12/02/20 Mayford Knife) ?Weight: 67.6kg ?Age: 81 ?Scr: 0.71 (12/10/20) ?CrCl: 69.56ml/min ? ?Appropriate dose and refill sent to requested pharmacy.  ?

## 2021-05-29 DIAGNOSIS — I4891 Unspecified atrial fibrillation: Secondary | ICD-10-CM | POA: Diagnosis not present

## 2021-05-29 DIAGNOSIS — I10 Essential (primary) hypertension: Secondary | ICD-10-CM | POA: Diagnosis not present

## 2021-05-29 DIAGNOSIS — E782 Mixed hyperlipidemia: Secondary | ICD-10-CM | POA: Diagnosis not present

## 2021-05-29 DIAGNOSIS — M810 Age-related osteoporosis without current pathological fracture: Secondary | ICD-10-CM | POA: Diagnosis not present

## 2021-10-21 DIAGNOSIS — I10 Essential (primary) hypertension: Secondary | ICD-10-CM | POA: Diagnosis not present

## 2021-10-21 DIAGNOSIS — D6869 Other thrombophilia: Secondary | ICD-10-CM | POA: Diagnosis not present

## 2021-10-21 DIAGNOSIS — I480 Paroxysmal atrial fibrillation: Secondary | ICD-10-CM | POA: Diagnosis not present

## 2021-10-21 DIAGNOSIS — Z23 Encounter for immunization: Secondary | ICD-10-CM | POA: Diagnosis not present

## 2021-10-21 DIAGNOSIS — E7800 Pure hypercholesterolemia, unspecified: Secondary | ICD-10-CM | POA: Diagnosis not present

## 2021-10-21 DIAGNOSIS — M810 Age-related osteoporosis without current pathological fracture: Secondary | ICD-10-CM | POA: Diagnosis not present

## 2022-02-15 ENCOUNTER — Other Ambulatory Visit: Payer: Self-pay | Admitting: Cardiology

## 2022-02-15 DIAGNOSIS — I4819 Other persistent atrial fibrillation: Secondary | ICD-10-CM

## 2022-02-15 NOTE — Telephone Encounter (Signed)
Prescription refill request for Xarelto received.  Indication:afib Last office visit:upcoming Weight:67.6  kg Age:82 Scr:0.7 CrCl:69.54  ml/min  Prescription refilled

## 2022-03-05 ENCOUNTER — Ambulatory Visit: Payer: Medicare Other | Admitting: Cardiology

## 2022-05-19 DIAGNOSIS — E7800 Pure hypercholesterolemia, unspecified: Secondary | ICD-10-CM | POA: Diagnosis not present

## 2022-05-19 DIAGNOSIS — I480 Paroxysmal atrial fibrillation: Secondary | ICD-10-CM | POA: Diagnosis not present

## 2022-05-19 DIAGNOSIS — I77810 Thoracic aortic ectasia: Secondary | ICD-10-CM | POA: Diagnosis not present

## 2022-05-19 DIAGNOSIS — Z0000 Encounter for general adult medical examination without abnormal findings: Secondary | ICD-10-CM | POA: Diagnosis not present

## 2022-05-19 DIAGNOSIS — D6869 Other thrombophilia: Secondary | ICD-10-CM | POA: Diagnosis not present

## 2022-05-19 DIAGNOSIS — R001 Bradycardia, unspecified: Secondary | ICD-10-CM | POA: Diagnosis not present

## 2022-06-03 DIAGNOSIS — R001 Bradycardia, unspecified: Secondary | ICD-10-CM | POA: Diagnosis not present

## 2022-06-11 ENCOUNTER — Other Ambulatory Visit: Payer: Self-pay | Admitting: Cardiology

## 2022-06-11 DIAGNOSIS — I4819 Other persistent atrial fibrillation: Secondary | ICD-10-CM

## 2022-06-11 NOTE — Telephone Encounter (Signed)
Xarelto 20mg  refill request received. Pt is 82 years old, weight-67.6kg, Crea-0.80 on 10/21/2021 via [facility] from [place], last seen by Armanda Magic on 12/02/2020 (was due in 6 months from that visit per OV note) she has an appt scheduled 07/15/22, Diagnosis-Afib, CrCl-59.85 mL/min; Dose is appropriate based on dosing criteria.  Last refill was sent 02/15/22 with 90 tabs and a refill. Will send a supply and no refill since appt pending in June 2024.

## 2022-06-17 DIAGNOSIS — D6869 Other thrombophilia: Secondary | ICD-10-CM | POA: Diagnosis not present

## 2022-06-17 DIAGNOSIS — I480 Paroxysmal atrial fibrillation: Secondary | ICD-10-CM | POA: Diagnosis not present

## 2022-06-17 DIAGNOSIS — I10 Essential (primary) hypertension: Secondary | ICD-10-CM | POA: Diagnosis not present

## 2022-06-17 DIAGNOSIS — R001 Bradycardia, unspecified: Secondary | ICD-10-CM | POA: Diagnosis not present

## 2022-07-14 ENCOUNTER — Ambulatory Visit: Payer: Medicare Other | Attending: Cardiology | Admitting: Cardiology

## 2022-07-14 ENCOUNTER — Encounter: Payer: Self-pay | Admitting: Cardiology

## 2022-07-14 VITALS — BP 126/60 | HR 44 | Ht 60.0 in | Wt 132.4 lb

## 2022-07-14 DIAGNOSIS — I10 Essential (primary) hypertension: Secondary | ICD-10-CM | POA: Diagnosis not present

## 2022-07-14 DIAGNOSIS — I4819 Other persistent atrial fibrillation: Secondary | ICD-10-CM

## 2022-07-14 MED ORDER — METOPROLOL TARTRATE 50 MG PO TABS: 50.0000 mg | ORAL_TABLET | Freq: Two times a day (BID) | ORAL | 3 refills | Status: DC

## 2022-07-14 NOTE — Patient Instructions (Signed)
Medication Instructions:  Please DECREASE your lopressor (metoprolol) dose to 50 mg BID.  *If you need a refill on your cardiac medications before your next appointment, please call your pharmacy*   Lab Work: None.  If you have labs (blood work) drawn today and your tests are completely normal, you will receive your results only by: MyChart Message (if you have MyChart) OR A paper copy in the mail If you have any lab test that is abnormal or we need to change your treatment, we will call you to review the results.   Testing/Procedures: None.   Follow-Up:  We recommend signing up for the patient portal called "MyChart".  Sign up information is provided on this After Visit Summary.  MyChart is used to connect with patients for Virtual Visits (Telemedicine).  Patients are able to view lab/test results, encounter notes, upcoming appointments, etc.  Non-urgent messages can be sent to your provider as well.   To learn more about what you can do with MyChart, go to [email or web address].    Your next appointment:   6 month(s)  Provider:   Armanda Magic, MD     Other Instructions Dr. Mayford Knife has referred you to the AFIB clinic. Someone will call you to set this appointment up.

## 2022-07-14 NOTE — Addendum Note (Signed)
Addended by: Luellen Pucker on: 07/14/2022 09:22 AM   Modules accepted: Orders

## 2022-07-14 NOTE — Progress Notes (Signed)
Date:  07/14/2022   ID:  RASHAD OBEID, DOB [birth year], MRN [id number]   PCP:  Renford Dills, MD  Cardiologist:  Armanda Magic, MD  Electrophysiologist:  None   Chief Complaint:  Atrial fibrillation and HTN  History of Present Illness:    Teresa White is a 82 y.o. female  with a hx of persistent atrial fibrillation and HTN. She is here today for followup and is doing well.  She tells me that she had a flu illness for several months.  She denies any chest pain or pressure, SOB, DOE, PND, orthopnea, LE edema, dizziness, palpitations or syncope. She is compliant with her meds and is tolerating meds with no SE.    Prior CV studies:   The following studies were reviewed today:  EKG and outside labs from PCP   Past Medical History:  Diagnosis Date   Anxiety    Back pain    history of abnormal MRI mild spinal stenosis at L3-4 and L4-5 small central disc herniation at L2-3 small L sided disc and Herniation at L1   Depression    Hypercholesteremia    Hypertension    Obesity (BMI 30-39.9) 10/16/2015   PAF (paroxysmal atrial fibrillation) (HCC)    PVC (premature ventricular contraction)    Past Surgical History:  Procedure Laterality Date   ABDOMINAL SURGERY       Current Meds  Medication Sig   acetaminophen (TYLENOL) 500 MG tablet Take 2 tablets (1,000 mg total) by mouth every 6 (six) hours as needed for mild pain.   ALPRAZolam (XANAX) 0.25 MG tablet Take 0.25 mg by mouth as needed for anxiety.    amLODipine (NORVASC) 5 MG tablet Take 1 tablet by mouth daily.   docusate sodium (COLACE) 100 MG capsule Take 1 capsule (100 mg total) by mouth 2 (two) times daily.   hydrochlorothiazide (HYDRODIURIL) 12.5 MG tablet Take 12.5 mg by mouth every morning.    losartan-hydrochlorothiazide (HYZAAR) 100-25 MG tablet Take 1 tablet by mouth daily.   methocarbamol (ROBAXIN) 500 MG tablet Take 1 tablet (500 mg total) by mouth every 8 (eight) hours as needed for muscle spasms.    Metoprolol Tartrate 75 MG TABS Take 1 tablet by mouth 2 (two) times daily.   oxyCODONE (OXY IR/ROXICODONE) 5 MG immediate release tablet Take 1 tablet (5 mg total) by mouth every 6 (six) hours as needed for severe pain.   polyethylene glycol (MIRALAX / GLYCOLAX) 17 g packet Take 17 g by mouth daily.   potassium chloride SA (KLOR-CON) 20 MEQ tablet Take 1 tablet (20 mEq total) by mouth daily.   rivaroxaban (XARELTO) 20 MG TABS tablet Take 1 tablet (20 mg total) by mouth daily. Please keep upcoming appt for further refills. Thanks   sertraline (ZOLOFT) 25 MG tablet Take 25 mg by mouth daily.   simethicone (MYLICON) 125 MG chewable tablet Chew 125 mg by mouth daily as needed for flatulence. For gas   simvastatin (ZOCOR) 20 MG tablet Take 20 mg by mouth every evening.   VITAMIN D PO Take 1 tablet by mouth daily.      Allergies:   Azithromycin, Demerol, Erythromycin, Demerol  [meperidine hcl], Flonase [fluticasone propionate], Meperidine, and Vicodin [hydrocodone-acetaminophen]   Social History   Tobacco Use   Smoking status: Former    Types: Cigarettes    Quit date: 01/18/1998    Years since quitting: 24.5   Smokeless tobacco: Never  Substance Use Topics   Alcohol use: No  Alcohol/week: 0.0 standard drinks of alcohol   Drug use: No     Family Hx: The patient's family history includes Diabetes in her brother and sister; Kidney disease in her mother; Other in her father.  ROS:   Please see the history of present illness.     All other systems reviewed and are negative.   Labs/Other Tests and Data Reviewed:    EKG Interpretation  Date/Time:  Wednesday July 14 2022 08:56:23 EDT Ventricular Rate:  42 PR Interval:    QRS Duration: 98 QT Interval:  460 QTC Calculation: 384 R Axis:   -64 Text Interpretation: Atrial flutter with variable A-V block Left axis deviation Pulmonary disease pattern Nonspecific ST and T wave abnormality Since last tracing atrial flutter is new Confirmed by  Armanda Magic ([id number]) on 07/14/2022 9:07:56 AM    Recent Labs: No results found for requested labs within last 365 days.   Recent Lipid Panel No results found for: "CHOL", "TRIG", "HDL", "CHOLHDL", "LDLCALC", "LDLDIRECT"  Wt Readings from Last 3 Encounters:  07/14/22 132 lb 6.4 oz (60.1 kg)  12/02/20 149 lb (67.6 kg)  11/30/19 152 lb (68.9 kg)     Objective:    Vital Signs:  BP 126/60   Pulse (!) 44   Ht 5' (1.524 m)   Wt 132 lb 6.4 oz (60.1 kg)   SpO2 95%   BMI 25.86 kg/m   GEN: Well nourished, well developed in no acute distress HEENT: Normal NECK: No JVD; No carotid bruits LYMPHATICS: No lymphadenopathy CARDIAC:RRR, no murmurs, rubs, gallops RESPIRATORY:  Clear to auscultation without rales, wheezing or rhonchi  ABDOMEN: Soft, non-tender, non-distended MUSCULOSKELETAL:  No edema; No deformity  SKIN: Warm and dry NEUROLOGIC:  Alert and oriented x 3 PSYCHIATRIC:  Normal affect   ASSESSMENT & PLAN:    1. PAF/flutter -today she is in atrial flutter with variable block and SVR with HR in the 40's -She denies any bleeding issues on DOAC -Continue prescription drug management with Xarelto 20 mg daily with as needed refills -I have personally reviewed and interpreted outside labs performed by patient's PCP which showed serum creatinine 0.8 and potassium 3.7 on 10/21/2021 and hemoglobin 12.7 on 05/19/2022 -I am going to refer her to afib clinic>>consider DCCV vs. Pursue rate control.  She has not missed any doses of Xarelto in 4 weeks -decrease Lopressor to 50mg  BID due to bradycardia  2.  Hypertension  -BP is well-controlled on exam today -Continue drug management with amlodipine 5 mg daily, HCTZ 12.5 mg daily, lopresssor 50mg  BID  with as needed refills  Medication Adjustments/Labs and Tests Ordered: Current medicines are reviewed at length with the patient today.  Concerns regarding medicines are outlined above.  Tests Ordered: Orders Placed This Encounter  Procedures    EKG 12-Lead    Medication Changes: No orders of the defined types were placed in this encounter.    Disposition:  Follow up in 6 month(s)  Signed, Armanda Magic, MD  07/14/2022 9:09 AM    [facility]

## 2022-07-15 ENCOUNTER — Ambulatory Visit: Payer: Medicare Other | Admitting: Cardiology

## 2022-07-26 ENCOUNTER — Ambulatory Visit (HOSPITAL_COMMUNITY): Payer: Medicare Other | Admitting: Internal Medicine

## 2022-08-06 ENCOUNTER — Ambulatory Visit (HOSPITAL_COMMUNITY): Payer: Medicare Other | Admitting: Internal Medicine

## 2022-08-31 ENCOUNTER — Ambulatory Visit (HOSPITAL_COMMUNITY): Payer: Medicare Other | Admitting: Internal Medicine

## 2022-09-14 ENCOUNTER — Ambulatory Visit (HOSPITAL_COMMUNITY): Payer: Medicare Other | Admitting: Internal Medicine

## 2022-09-23 ENCOUNTER — Ambulatory Visit (HOSPITAL_COMMUNITY)
Admission: RE | Admit: 2022-09-23 | Discharge: 2022-09-23 | Disposition: A | Discharge status: Home / Self Care | Payer: Medicare Other | Source: Ambulatory Visit | Attending: Internal Medicine | Admitting: Internal Medicine

## 2022-09-23 VITALS — BP 140/64 | HR 47 | Ht 60.0 in | Wt 135.8 lb

## 2022-09-23 DIAGNOSIS — D6869 Other thrombophilia: Secondary | ICD-10-CM | POA: Insufficient documentation

## 2022-09-23 DIAGNOSIS — I4892 Unspecified atrial flutter: Secondary | ICD-10-CM | POA: Diagnosis not present

## 2022-09-23 DIAGNOSIS — Z7901 Long term (current) use of anticoagulants: Secondary | ICD-10-CM | POA: Insufficient documentation

## 2022-09-23 DIAGNOSIS — I110 Hypertensive heart disease with heart failure: Secondary | ICD-10-CM | POA: Insufficient documentation

## 2022-09-23 DIAGNOSIS — I4819 Other persistent atrial fibrillation: Secondary | ICD-10-CM | POA: Insufficient documentation

## 2022-09-23 MED ORDER — METOPROLOL TARTRATE 25 MG PO TABS: 25.0000 mg | ORAL_TABLET | Freq: Two times a day (BID) | ORAL | 3 refills | Status: DC

## 2022-09-23 NOTE — Progress Notes (Addendum)
Primary Care Physician: Renford Dills, MD Primary Cardiologist: Armanda Magic, MD Electrophysiologist: None     Referring Physician: Dr. [name] is a 82 y.o. female with a history of HTN, atrial flutter, and persistent atrial fibrillation who presents for consultation in the [facility]. Seen by Dr. Mayford Knife on 6/26 and noted to be in slow atrial flutter; Lopressor decreased to 50 mg BID. Patient is on Xarelto 20 mg daily for a CHADS2VASC score of 4.  On evaluation today, she is currently in NSR. She states did not have cardiac awareness of arrhythmia. She has been preoccupied the past couple weeks with her daughter making " a wrong decision" and states she took too many pills. She is compliant with Xarelto and has no bleeding issues.    Today, she denies symptoms of palpitations, chest pain, shortness of breath, orthopnea, PND, lower extremity edema, dizziness, presyncope, syncope, snoring, daytime somnolence, bleeding, or neurologic sequela. The patient is tolerating medications without difficulties and is otherwise without complaint today.   she has a BMI of Body mass index is 26.52 kg/m.Marland Kitchen Filed Weights   09/23/22 0825  Weight: 61.6 kg    Current Outpatient Medications  Medication Sig Dispense Refill   acetaminophen (TYLENOL) 500 MG tablet Take 2 tablets (1,000 mg total) by mouth every 6 (six) hours as needed for mild pain. 30 tablet 0   ALPRAZolam (XANAX) 0.25 MG tablet Take 0.25 mg by mouth as needed for anxiety.      amLODipine (NORVASC) 5 MG tablet Take 1 tablet by mouth daily.     docusate sodium (COLACE) 100 MG capsule Take 1 capsule (100 mg total) by mouth 2 (two) times daily. (Patient taking differently: Take 100 mg by mouth as needed.) 10 capsule 0   loratadine (CLARITIN) 10 MG tablet Take 10 mg by mouth as needed.     losartan-hydrochlorothiazide (HYZAAR) 100-25 MG tablet Take 1 tablet by mouth daily.     methocarbamol  (ROBAXIN) 500 MG tablet Take 1 tablet (500 mg total) by mouth every 8 (eight) hours as needed for muscle spasms. 30 tablet 0   polyethylene glycol (MIRALAX / GLYCOLAX) 17 g packet Take 17 g by mouth daily. 14 each 0   potassium chloride SA (KLOR-CON) 20 MEQ tablet Take 1 tablet (20 mEq total) by mouth daily. 90 tablet 3   rivaroxaban (XARELTO) 20 MG TABS tablet Take 1 tablet (20 mg total) by mouth daily. Please keep upcoming appt for further refills. Thanks 90 tablet 0   sertraline (ZOLOFT) 25 MG tablet Take 25 mg by mouth daily.     simvastatin (ZOCOR) 20 MG tablet Take 20 mg by mouth every evening.     sodium chloride (OCEAN) 0.65 % nasal spray Place 2 sprays into the nose as needed.     VITAMIN D PO Take 1 tablet by mouth daily.      metoprolol tartrate (LOPRESSOR) 25 MG tablet Take 1 tablet (25 mg total) by mouth 2 (two) times daily. 180 tablet 3   No current facility-administered medications for this encounter.    Atrial Fibrillation Management history:  Previous antiarrhythmic drugs: None Previous cardioversions: None Previous ablations: None Anticoagulation history: Xarelto    ROS- All systems are reviewed and negative except as per the HPI above.  Physical Exam: BP (!) 140/64   Pulse (!) 47   Ht 5' (1.524 m)   Wt 61.6 kg   BMI 26.52 kg/m  GEN: Well nourished, well developed in no acute distress NECK: No JVD; No carotid bruits CARDIAC: Regular bradycardic rate and rhythm, no murmurs, rubs, gallops RESPIRATORY:  Clear to auscultation without rales, wheezing or rhonchi  ABDOMEN: Soft, non-tender, non-distended EXTREMITIES:  No edema; No deformity   EKG today demonstrates  Vent. rate 47 BPM PR interval 244 ms QRS duration 96 ms QT/QTcB 438/387 ms P-R-T axes -13 128 17 Sinus bradycardia with sinus arrhythmia with 1st degree A-V block Left posterior fascicular block Cannot rule out Inferior infarct , age undetermined Anterior infarct , age undetermined Abnormal  ECG When compared with ECG of 14-Jul-2022 08:56, PREVIOUS ECG IS PRESENT  Echo 07/19/13 demonstrated normal function.  ASSESSMENT & PLAN CHA2DS2-VASc Score = 4  The patient's score is based upon: CHF History: 0 HTN History: 1 Diabetes History: 0 Stroke History: 0 Vascular Disease History: 0 Age Score: 2 Gender Score: 1       ASSESSMENT AND PLAN: Persistent Atrial Fibrillation (ICD10:  I48.19) / Atrial flutter The patient's CHA2DS2-VASc score is 4, indicating a 4.8% annual risk of stroke.    She is currently in NSR.  Discussion of cardiac monitor and treatment options. Rhythm monitoring device recommended. After discussion, she has declined monitor for now. She would like to wait on doing anything due to dealing with issue with daughter.   Due to bradycardia, recommended for patient to decrease Lopressor to 25 mg BID.   Secondary Hypercoagulable State (ICD10:  D68.69) The patient is at significant risk for stroke/thromboembolism based upon her CHA2DS2-VASc Score of 4.  Continue Rivaroxaban (Xarelto).  No missed doses.   Follow up January Afib clinic.    [place] Bells, PA-C  [facility] [address] [place], [place] [address] [phone]

## 2022-09-23 NOTE — Patient Instructions (Addendum)
Kardia mobile    Decrease metoprolol to 25mg  twice a day

## 2022-11-13 ENCOUNTER — Other Ambulatory Visit: Payer: Self-pay | Admitting: Cardiology

## 2022-11-13 DIAGNOSIS — I4819 Other persistent atrial fibrillation: Secondary | ICD-10-CM

## 2022-11-15 NOTE — Telephone Encounter (Signed)
Prescription refill request for Xarelto received.  Indication:afib Last office visit:9/24 Weight:61.6  kg Age:82 JXB:JYNWG labs CrCl:needs labs  Prescription refilled

## 2022-12-01 ENCOUNTER — Ambulatory Visit: Payer: Medicare Other | Admitting: Cardiology

## 2022-12-10 DIAGNOSIS — Z23 Encounter for immunization: Secondary | ICD-10-CM | POA: Diagnosis not present

## 2022-12-10 DIAGNOSIS — R001 Bradycardia, unspecified: Secondary | ICD-10-CM | POA: Diagnosis not present

## 2022-12-10 DIAGNOSIS — I10 Essential (primary) hypertension: Secondary | ICD-10-CM | POA: Diagnosis not present

## 2022-12-10 DIAGNOSIS — E7800 Pure hypercholesterolemia, unspecified: Secondary | ICD-10-CM | POA: Diagnosis not present

## 2022-12-10 DIAGNOSIS — D6869 Other thrombophilia: Secondary | ICD-10-CM | POA: Diagnosis not present

## 2022-12-10 DIAGNOSIS — I4891 Unspecified atrial fibrillation: Secondary | ICD-10-CM | POA: Diagnosis not present

## 2022-12-10 DIAGNOSIS — I480 Paroxysmal atrial fibrillation: Secondary | ICD-10-CM | POA: Diagnosis not present

## 2022-12-10 DIAGNOSIS — I77810 Thoracic aortic ectasia: Secondary | ICD-10-CM | POA: Diagnosis not present

## 2022-12-10 NOTE — Progress Notes (Deleted)
  Cardiology Office Note:  .   Date:  12/10/2022  ID:  [name], DOB October 04, 1940, MRN [id number] PCP: Renford Dills, MD  [facility] Providers Cardiologist: Armanda Magic, MD { }   }   History of Present Illness: .   Teresa White is a 82 y.o. female history of HTN, atrial flutter, and persistent atrial fibrillation , also followed in the Afib clinic.When last seen on 09/23/2022 lopressor was decreased to 25 mg BID. Remained on Xarelto with CHADS VASC Score of 4.Needs CMET and CBC for Xarelto refills.  ROS: ***  Studies Reviewed: .        *** EKG Interpretation Date/Time:    Ventricular Rate:    PR Interval:    QRS Duration:    QT Interval:    QTC Calculation:   R Axis:      Text Interpretation:      Physical Exam:   VS:  There were no vitals taken for this visit.   Wt Readings from Last 3 Encounters:  09/23/22 135 lb 12.8 oz (61.6 kg)  07/14/22 132 lb 6.4 oz (60.1 kg)  12/02/20 149 lb (67.6 kg)    GEN: Well nourished, well developed in no acute distress NECK: No JVD; No carotid bruits CARDIAC: ***RRR, no murmurs, rubs, gallops RESPIRATORY:  Clear to auscultation without rales, wheezing or rhonchi  ABDOMEN: Soft, non-tender, non-distended EXTREMITIES:  No edema; No deformity   ASSESSMENT AND PLAN: .   ***    {Are you ordering a CV Procedure (e.g. stress test, cath, DCCV, TEE, etc)?   Press F2        :[id number]}    Signed, Bettey Mare. Liborio Nixon, ANP, AACC

## 2022-12-13 ENCOUNTER — Ambulatory Visit: Payer: Medicare Other | Admitting: Adult Health

## 2023-01-25 ENCOUNTER — Other Ambulatory Visit: Payer: Self-pay | Admitting: Cardiology

## 2023-01-25 DIAGNOSIS — I4819 Other persistent atrial fibrillation: Secondary | ICD-10-CM

## 2023-01-25 NOTE — Telephone Encounter (Signed)
 Prescription refill request for Xarelto received.  Indication:afib Last office visit:9/24 Weight:61.6  kg Age:83 JXB:JYNWG labs CrCl:needs labs  Prescription refilled

## 2023-01-26 ENCOUNTER — Ambulatory Visit (HOSPITAL_COMMUNITY): Payer: Medicare Other | Admitting: Internal Medicine

## 2023-02-14 NOTE — Progress Notes (Unsigned)
Cardiology Office Note    Date:  02/16/2023  ID:  Teresa White, DOB 31-Oct-1940, MRN [id number] PCP:  Renford Dills, MD  Cardiologist:  Armanda Magic, MD  Electrophysiologist:  None   Chief Complaint: f/u afib/flutter  History of Present Illness: .    Teresa White is a 83 y.o. female with visit-pertinent history of persistent atrial fib/flutter, bradycardia, PACs, HTN, HLD, depression, anxiety, back pain seen for 6 month follow-up. Her arrhythmia seems paroxysmal in nature. In 06/2022 she was in slow atrial flutter in the 40s prompting reduction in metoprolol. When she saw AF clinic back in 09/2022 she was back in NSR (SB) 47bpm so metoprolol dose was reduced further. Last ischemic assessment was by nuc 2016 which was normal, but unable to gate due to frequent PACs. Last echo 2015 EF 50-55%, mild LAE/RAE, mildly dilated ascending aorta.  She returns for follow-up reporting she is doing well without any CP or SOB. No palpitations. She is unaware of when she goes in and out of afib. Denies any unusual bleeding or s/sx of GIB. She reports she is constantly on the go and loves to walk. She has been dealing with some generalized anxiety recently about her mortality. Reports she tends to be an anxious person at baseline, plans to discuss with PCP. No SI/HI.  Labwork independently reviewed: [facility] 11/2022 LDL 82, trig 116, Hgb 13.3, K 4.5, ALT 18, TSH wnl, Cr not reported 11/2020 K 3.8 Cr 0.71 2020 Hgb 12.2, Plt 257  ROS: .    Please see the history of present illness.  All other systems are reviewed and otherwise negative.  Studies Reviewed: Marland Kitchen    EKG:  EKG is ordered today, personally reviewed, demonstrating coarse afib vs flutter 56bpm, nonspecific STTW changes, no acute change from prior   CV Studies: Cardiac studies reviewed are outlined and summarized above. Otherwise please see EMR for full report.   Current Reported Medications:.    Current Meds  Medication Sig    acetaminophen (TYLENOL) 500 MG tablet Take 2 tablets (1,000 mg total) by mouth every 6 (six) hours as needed for mild pain.   ALPRAZolam (XANAX) 0.25 MG tablet Take 0.25 mg by mouth as needed for anxiety.    amLODipine (NORVASC) 5 MG tablet Take 1 tablet by mouth daily.   docusate sodium (COLACE) 100 MG capsule Take 1 capsule (100 mg total) by mouth 2 (two) times daily. (Patient taking differently: Take 100 mg by mouth as needed.)   loratadine (CLARITIN) 10 MG tablet Take 10 mg by mouth as needed.   losartan-hydrochlorothiazide (HYZAAR) 100-25 MG tablet Take 1 tablet by mouth daily.   methocarbamol (ROBAXIN) 500 MG tablet Take 1 tablet (500 mg total) by mouth every 8 (eight) hours as needed for muscle spasms.   polyethylene glycol (MIRALAX / GLYCOLAX) 17 g packet Take 17 g by mouth daily.   potassium chloride SA (KLOR-CON) 20 MEQ tablet Take 1 tablet (20 mEq total) by mouth daily.   rivaroxaban (XARELTO) 20 MG TABS tablet TAKE 1 TABLET (20 MG TOTAL) BY MOUTH DAILY WITH SUPPER. NEEDS LABS AT NEXT APPT FOR XARELTO REFILLS   sertraline (ZOLOFT) 25 MG tablet Take 25 mg by mouth daily.   simvastatin (ZOCOR) 20 MG tablet Take 20 mg by mouth every evening.   sodium chloride (OCEAN) 0.65 % nasal spray Place 2 sprays into the nose as needed.   VITAMIN D PO Take 1 tablet by mouth daily.     Physical Exam:  VS:  BP 130/68   Pulse (!) 56   Ht 5\' 3"  (1.6 m)   Wt 125 lb (56.7 kg)   SpO2 99%   BMI 22.14 kg/m    Wt Readings from Last 3 Encounters:  02/16/23 125 lb (56.7 kg)  09/23/22 135 lb 12.8 oz (61.6 kg)  07/14/22 132 lb 6.4 oz (60.1 kg)    GEN: Well nourished, well developed in no acute distress NECK: No JVD; No carotid bruits CARDIAC: irregularly irregular, no murmurs, rubs, gallops RESPIRATORY:  Clear to auscultation without rales, wheezing or rhonchi  ABDOMEN: Soft, non-tender, non-distended EXTREMITIES:  No edema; No acute deformity   Asessement and Plan:.    1. Persistent atrial  fib/flutter, with history of sinus bradycardia and PACs - she is back in rate controlled coarse afib/flutter today but remains asymptomatic. She has historically been managed with rate control strategy which seems appropriate. HR 56bpm by EKG, ~50bpm on exam. Will decrease metoprolol further to 12.5mg  BID and see how she does. I asked her to check her HR at home for 1 week after making this change and calling us with this result. We will update BMET, CBC, TSH today for our records to help ensure Xarelto dose is appropriate, can refill further once this is back.  2. Essential HTN - top normal, acceptable for age. No changes to regimen otherwise today aside from metoprolol (continue amlodipine, losartan-hydrochlorothiazide). Note she is also on simvastatin at the direction of PCP, currently at max dose given concomitant amlodipine.  3. Mildly dilated ascending aorta - due for repeat echo, will arrange.  4. Anxiety - no SI/HI. She also has been slowly losing weight per chart review over the past several years. I encouraged her to follow-up with her PCP. She reports she has an appointment with him in February. She does not want to move this up at this time.    Disposition: F/u with Dr. Mayford Knife or myself in 6 months. Given symptom and HR stability at this time I think she can hold off afib clinic and consolidate care back to gen cards.  Signed, Laurann Montana, PA-C

## 2023-02-16 ENCOUNTER — Ambulatory Visit: Payer: Medicare Other | Attending: Physician Assistant | Admitting: Physician Assistant

## 2023-02-16 ENCOUNTER — Encounter: Payer: Self-pay | Admitting: Physician Assistant

## 2023-02-16 ENCOUNTER — Other Ambulatory Visit: Payer: Self-pay | Admitting: *Deleted

## 2023-02-16 VITALS — BP 130/68 | HR 56 | Ht 63.0 in | Wt 125.0 lb

## 2023-02-16 DIAGNOSIS — F419 Anxiety disorder, unspecified: Secondary | ICD-10-CM

## 2023-02-16 DIAGNOSIS — I77819 Aortic ectasia, unspecified site: Secondary | ICD-10-CM | POA: Diagnosis not present

## 2023-02-16 DIAGNOSIS — I4892 Unspecified atrial flutter: Secondary | ICD-10-CM | POA: Diagnosis not present

## 2023-02-16 DIAGNOSIS — I10 Essential (primary) hypertension: Secondary | ICD-10-CM

## 2023-02-16 DIAGNOSIS — I4819 Other persistent atrial fibrillation: Secondary | ICD-10-CM

## 2023-02-16 DIAGNOSIS — I491 Atrial premature depolarization: Secondary | ICD-10-CM

## 2023-02-16 DIAGNOSIS — R001 Bradycardia, unspecified: Secondary | ICD-10-CM

## 2023-02-16 LAB — CBC

## 2023-02-16 MED ORDER — METOPROLOL TARTRATE 25 MG PO TABS: 12.5000 mg | ORAL_TABLET | Freq: Two times a day (BID) | ORAL | 3 refills | Status: DC

## 2023-02-16 NOTE — Telephone Encounter (Signed)
Prescription refill request for Xarelto received.  Indication: Afib  Last office visit: 02/16/23 Shea Evans)  Weight: 56.7kg Age: 83 Scr: 0.70 (12/10/20) - OVERDUE CrCl: 57.72ml/min  Labs overdue. Pt has updated labs drawn today at office visit. Will refill once labs have resulted.

## 2023-02-16 NOTE — Patient Instructions (Addendum)
Medication Instructions:  1.Decrease metoprolol tartrate (Lopressor) to 12.5 mg twice daily, (this will be 1/2 of the 25 mg tablet twice a day)   Lab Work: BMET, CBC, TSH-TODAY If you have labs (blood work) drawn today and your tests are completely normal, you will receive your results only by: [employer] (if you have MyChart) OR A paper copy in the mail If you have any lab test that is abnormal or we need to change your treatment, we will call you to review the results.   Testing/Procedures: Your physician has requested that you have an echocardiogram. Echocardiography is a painless test that uses sound waves to create images of your heart. It provides your doctor with information about the size and shape of your heart and how well your heart's chambers and valves are working. This procedure takes approximately one hour. There are no restrictions for this procedure. Please do NOT wear cologne, perfume, aftershave, or lotions (deodorant is allowed). Please arrive 15 minutes prior to your appointment time.  Please note: We ask at that you not bring children with you during ultrasound (echo/ vascular) testing. Due to room size and safety concerns, children are not allowed in the ultrasound rooms during exams. Our front office staff cannot provide observation of children in our lobby area while testing is being conducted. An adult accompanying a patient to their appointment will only be allowed in the ultrasound room at the discretion of the ultrasound technician under special circumstances. We apologize for any inconvenience.    Follow-Up: At [facility], you and your health needs are our priority.  As part of our continuing mission to provide you with exceptional heart care, we have created designated Provider Care Teams.  These Care Teams include your primary Cardiologist (physician) and Advanced Practice Providers (APPs -  Physician Assistants and Nurse Practitioners) who all  work together to provide you with the care you need, when you need it.  We recommend signing up for the patient portal called "MyChart".  Sign up information is provided on this After Visit Summary.  MyChart is used to connect with patients for Virtual Visits (Telemedicine).  Patients are able to view lab/test results, encounter notes, upcoming appointments, etc.  Non-urgent messages can be sent to your provider as well.   To learn more about what you can do with MyChart, go to [email or web address].    Your next appointment:   6 month(s)  Provider:   Armanda Magic, MD  or Ronie Spies, PA-C

## 2023-02-16 NOTE — Telephone Encounter (Signed)
Patient saw Teresa White today, labs ordered. Pt was concerned about being able to receive refills of this medication. Informed pt that I would route refill request to be reviewed once lab results received.  Thanks, [name]

## 2023-02-17 LAB — CBC
Hematocrit: 42.2 % (ref 34.0–46.6)
Hemoglobin: 13.9 g/dL (ref 11.1–15.9)
MCH: 30.8 pg (ref 26.6–33.0)
MCHC: 32.9 g/dL (ref 31.5–35.7)
MCV: 93 fL (ref 79–97)
Platelets: 263 10*3/uL (ref 150–450)
RBC: 4.52 x10E6/uL (ref 3.77–5.28)
RDW: 12.6 % (ref 11.7–15.4)
WBC: 5.3 10*3/uL (ref 3.4–10.8)

## 2023-02-17 LAB — BASIC METABOLIC PANEL
BUN/Creatinine Ratio: 26 (ref 12–28)
BUN: 24 mg/dL (ref 8–27)
CO2: 17 mmol/L — ABNORMAL LOW (ref 20–29)
Calcium: 9.9 mg/dL (ref 8.7–10.3)
Chloride: 100 mmol/L (ref 96–106)
Creatinine, Ser: 0.92 mg/dL (ref 0.57–1.00)
Glucose: 92 mg/dL (ref 70–99)
Potassium: 3.4 mmol/L — ABNORMAL LOW (ref 3.5–5.2)
Sodium: 138 mmol/L (ref 134–144)
eGFR: 63 mL/min/{1.73_m2} (ref >59)

## 2023-02-17 LAB — TSH: TSH: 1.84 u[IU]/mL (ref 0.450–4.500)

## 2023-02-17 MED ORDER — RIVAROXABAN 15 MG PO TABS: 15.0000 mg | ORAL_TABLET | Freq: Every day | ORAL | 1 refills | Status: DC

## 2023-02-17 NOTE — Telephone Encounter (Signed)
Spoke with patient and advised will reduce Xarelto dose. Patient voiced understanding

## 2023-02-17 NOTE — Telephone Encounter (Signed)
Last labs 02/16/23 Creat 0.92, age 83, weight 56.7kg, CrCl 43.65, based on CrCl pt is not on appropriate dosage of Xarelto.  CrCl 15-50 should be on Xarelto 15mg  every day, will send to pharmD to advise if dosage change appropriate.

## 2023-02-18 ENCOUNTER — Telehealth: Payer: Self-pay | Admitting: Cardiology

## 2023-02-18 NOTE — Telephone Encounter (Signed)
  Patient called this evening to clarify new Rx for Xarelto. She was reduced from 20mg  to 15mg  based on renal function and was confused because her new Rx states to take in the evening but she has always taken in the morning with breakfast. I confirmed with patient that morning dosing is perfectly appropriate as long as she takes with food (explained that Xarelto requires food in the stomach to properly absorb). Patient was appreciative of the call back and confirmed understanding.  Perlie Gold, PA-C

## 2023-02-23 ENCOUNTER — Telehealth: Payer: Self-pay

## 2023-02-23 DIAGNOSIS — E876 Hypokalemia: Secondary | ICD-10-CM

## 2023-02-23 MED ORDER — POTASSIUM CHLORIDE CRYS ER 20 MEQ PO TBCR: 20.0000 meq | EXTENDED_RELEASE_TABLET | Freq: Two times a day (BID) | ORAL | 3 refills | Status: DC

## 2023-02-23 NOTE — Telephone Encounter (Signed)
 Pt notified of lab results. Pt told to increase potassium to 40 meq daily. Pt asked for potassium to be refilled. Pt told to have a repeat BMP and Mag in roughly a week. Pt plans to have labs drawn same day as echo (2/14). Pt told to take potassium a few hours prior to getting repeat labs. Pt stated that her Xarelto  dose was already changed to 15 mg daily as seen in her chart. Potassium chloride  refilled. Labs ordered and released.

## 2023-02-23 NOTE — Telephone Encounter (Signed)
-----   Message from Dayna N Dunn sent at 02/17/2023  8:10 AM EST ----- Please let pt know labs are stable except potassium level is low and her kidney clearance now qualifies her for the lower dose of Xarelto .   Recommendations: - please make sure she has been taking her potassium 20meq daily. I would have her take an extra 40meq today then increase dose to 40meq daily with recheck BMET/Mg in 1 week. She can either take this all at once daily or split into twice a day. The day that she gets labs make sure to have taken her supplement a few hours beforehand - creatinine clearance is now 43.55ml/min so need to change Xarelto  dose to 15mg  daily with supper, needs new rx (do not cut old rx)

## 2023-03-04 ENCOUNTER — Ambulatory Visit (HOSPITAL_COMMUNITY): Payer: Medicare Other | Attending: Physician Assistant

## 2023-03-04 ENCOUNTER — Telehealth: Payer: Self-pay | Admitting: Physician Assistant

## 2023-03-04 DIAGNOSIS — I493 Ventricular premature depolarization: Secondary | ICD-10-CM | POA: Insufficient documentation

## 2023-03-04 DIAGNOSIS — I77819 Aortic ectasia, unspecified site: Secondary | ICD-10-CM | POA: Diagnosis not present

## 2023-03-04 DIAGNOSIS — I10 Essential (primary) hypertension: Secondary | ICD-10-CM | POA: Diagnosis not present

## 2023-03-04 DIAGNOSIS — I4891 Unspecified atrial fibrillation: Secondary | ICD-10-CM | POA: Insufficient documentation

## 2023-03-04 DIAGNOSIS — I080 Rheumatic disorders of both mitral and aortic valves: Secondary | ICD-10-CM | POA: Insufficient documentation

## 2023-03-04 LAB — ECHOCARDIOGRAM COMPLETE
AV Vena cont: 0.2 cm
MV M vel: 5.37 m/s
MV Peak grad: 115.3 mm[Hg]
P 1/2 time: 662 ms
Radius: 0.47 cm
S' Lateral: 3.9 cm

## 2023-03-04 NOTE — Telephone Encounter (Signed)
Left message to call back

## 2023-03-04 NOTE — Telephone Encounter (Signed)
Patient stopped by the front desk, with a question after her ECHO today, asking about the potassium pills she has been prescribed.  She asked if she is only to take them for 7 days, or should she continue taking on a regular basis after the 7 days are up.

## 2023-03-04 NOTE — Telephone Encounter (Signed)
Intended to be ongoing rx, not short term. Due for repeat BMET/Mg, looks like she hopefully was going to get this today, make sure she remembered. Thank you!

## 2023-03-07 ENCOUNTER — Other Ambulatory Visit: Payer: Self-pay | Admitting: *Deleted

## 2023-03-07 DIAGNOSIS — I2721 Secondary pulmonary arterial hypertension: Secondary | ICD-10-CM | POA: Diagnosis not present

## 2023-03-07 DIAGNOSIS — E876 Hypokalemia: Secondary | ICD-10-CM | POA: Diagnosis not present

## 2023-03-07 DIAGNOSIS — I5189 Other ill-defined heart diseases: Secondary | ICD-10-CM | POA: Diagnosis not present

## 2023-03-07 NOTE — Telephone Encounter (Signed)
 Spoke with patient, informed her Teresa White states potassium Rx was intended to be ongoing and not short-term.  Patient verbalized understanding.  Patient did not have labs drawn yet, she will come in today to have BMET and magnesium drawn.

## 2023-03-07 NOTE — Telephone Encounter (Signed)
 Patient is returning call.

## 2023-03-08 LAB — MAGNESIUM: Magnesium: 2.1 mg/dL (ref 1.6–2.3)

## 2023-03-08 LAB — BASIC METABOLIC PANEL
BUN/Creatinine Ratio: 19 (ref 12–28)
BUN: 14 mg/dL (ref 8–27)
CO2: 22 mmol/L (ref 20–29)
Calcium: 9.9 mg/dL (ref 8.7–10.3)
Chloride: 102 mmol/L (ref 96–106)
Creatinine, Ser: 0.75 mg/dL (ref 0.57–1.00)
Glucose: 84 mg/dL (ref 70–99)
Potassium: 5.1 mmol/L (ref 3.5–5.2)
Sodium: 141 mmol/L (ref 134–144)
eGFR: 80 mL/min/{1.73_m2} (ref >59)

## 2023-03-09 LAB — PRO B NATRIURETIC PEPTIDE: NT-Pro BNP: 943 pg/mL — ABNORMAL HIGH (ref 0–738)

## 2023-03-09 LAB — SPECIMEN STATUS REPORT

## 2023-03-16 ENCOUNTER — Telehealth: Payer: Self-pay

## 2023-03-16 ENCOUNTER — Other Ambulatory Visit: Payer: Self-pay

## 2023-03-16 DIAGNOSIS — I5189 Other ill-defined heart diseases: Secondary | ICD-10-CM

## 2023-03-16 MED ORDER — FUROSEMIDE 20 MG PO TABS: 20.0000 mg | ORAL_TABLET | Freq: Every day | ORAL | 0 refills | Status: DC

## 2023-03-16 MED ORDER — POTASSIUM CHLORIDE CRYS ER 20 MEQ PO TBCR: 20.0000 meq | EXTENDED_RELEASE_TABLET | Freq: Once | ORAL | Status: DC

## 2023-03-16 MED ORDER — POTASSIUM CHLORIDE CRYS ER 20 MEQ PO TBCR: 20.0000 meq | EXTENDED_RELEASE_TABLET | Freq: Every day | ORAL | Status: DC

## 2023-03-16 MED ORDER — RIVAROXABAN 20 MG PO TABS
20.0000 mg | ORAL_TABLET | Freq: Every day | ORAL | 3 refills | Status: DC
Start: 2023-03-16 — End: 2023-03-26

## 2023-03-16 NOTE — Telephone Encounter (Signed)
 Randall An will be covering my inbox when this BMET comes back next week. Just want to bring her attention to verify the CrCl to ensure dose of Xarelto 20mg  is appropriate - has been up and down with the dose recently.

## 2023-03-16 NOTE — Telephone Encounter (Signed)
-----   Message from Laurann Montana sent at 03/10/2023  7:39 AM EST ----- Please let pt know that labs are overall stable. Her pro-BNP and echo suggest some fluid overload. Would recommend starting Lasix 20mg  daily x 3 days only since she is also already on losartan-hydrochlorothiazide. Recheck BMET in 1 week to trend kidney/potassium.  Surprised to see her potassium level went from 3.4 to 5.1 with just a small increase in her potassium from 1 tablet daily to 1 tablet twice a day - can you ask her to confirm she was taking her potassium supplement at the time of her office visit 1/29? If so, still keep plan for recheck BMET as above.  Will also cc labs to [facility], pharmD, for input whether we need to go back up on her Xarelto as she is right on the border with CrCl 52.27ml/min by clinical calculator - recently had gone down based on CrCl from 1/29 labs. Rechecking BMET in 1 week as above.

## 2023-03-16 NOTE — Telephone Encounter (Signed)
 I spoke with patient and she is aware of lab results and provider recommendations. She verbalized understanding. Repeated instructions back to me.  She states she was not taking a potassium supplement on 1/29. She would like to know do you want her to continue taking her potassium BID  Will send xarelto and lasix to pharmacy. BMET ordered

## 2023-03-16 NOTE — Telephone Encounter (Addendum)
 Per Teresa White patient is to decrease potassium 20 meq once a day. She verbalized understanding.

## 2023-03-16 NOTE — Addendum Note (Signed)
 Addended by: Judene Companion on: 03/16/2023 03:30 PM   Modules accepted: Orders

## 2023-03-24 DIAGNOSIS — I5189 Other ill-defined heart diseases: Secondary | ICD-10-CM | POA: Diagnosis not present

## 2023-03-24 DIAGNOSIS — E8770 Fluid overload, unspecified: Secondary | ICD-10-CM | POA: Diagnosis not present

## 2023-03-24 DIAGNOSIS — I10 Essential (primary) hypertension: Secondary | ICD-10-CM | POA: Diagnosis not present

## 2023-03-24 LAB — PRO B NATRIURETIC PEPTIDE: NT-Pro BNP: 1556 pg/mL — ABNORMAL HIGH (ref 0–738)

## 2023-03-25 ENCOUNTER — Telehealth: Payer: Self-pay

## 2023-03-25 DIAGNOSIS — I5189 Other ill-defined heart diseases: Secondary | ICD-10-CM

## 2023-03-25 NOTE — Telephone Encounter (Signed)
 Patient states she was barely awake when she initially took the results and would like another call to discuss again if possible.

## 2023-03-25 NOTE — Telephone Encounter (Signed)
-----   Message from Ellsworth Lennox sent at 03/25/2023  9:26 AM EST ----- Covering for Dayna - Please let the patient know that her fluid level does remain elevated with BNP at 1556.  She was supposed to have a BMET as well to recheck her electrolytes and renal function. Please see if they can add this on from her labs obtained yesterday. If not, unfortunately she will need to come back for a repeat BMET so we can safely adjust her medications and to ensure she is on the correct dose of Xarelto.

## 2023-03-25 NOTE — Telephone Encounter (Signed)
 Spoke with pt and reviewed her results with her again. Pt aware we are waiting for results from BMET that was added on to her labs from yesterday. Pt verbalized understanding.

## 2023-03-25 NOTE — Telephone Encounter (Signed)
 The patient has been notified of the result and verbalized understanding.  All questions (if any) were answered. A BMET was added on to the  labs the pt already had drawn. Erick Alley, RN 03/25/2023 2:15 PM

## 2023-03-26 ENCOUNTER — Other Ambulatory Visit: Payer: Self-pay | Admitting: Physician Assistant

## 2023-03-26 ENCOUNTER — Other Ambulatory Visit: Payer: Self-pay

## 2023-03-26 DIAGNOSIS — I5189 Other ill-defined heart diseases: Secondary | ICD-10-CM

## 2023-03-26 MED ORDER — RIVAROXABAN 15 MG PO TABS
15.0000 mg | ORAL_TABLET | Freq: Every day | ORAL | 1 refills | Status: AC
  Filled 2023-03-26: qty 90, 90d supply, fill #0

## 2023-03-26 NOTE — Addendum Note (Signed)
 Addended by: Laurann Montana on: 03/26/2023 09:17 AM   Modules accepted: Orders

## 2023-03-26 NOTE — Telephone Encounter (Addendum)
 Spoke with pt re labs: K 5.7. She confirms she is taking KCl once daily. Previously had low potassium of 3.4 while on maintenance losartan-hydrochlorothiazide in January so do not suspect related to long term ARB. (Question lab error versus hyperkalemia from potassium replacement.) Did not intend to repeat BNP but remains slightly elevated. Query related to atrial stretch from permanent atrial fib since today she reports she is feeling GREAT, no CP, SOB, orthopnea, edema or any new symptoms. She was just about to go take a walk. She reports home weight is at 132lb today (previous 125lb was stated, not measured weight) therefore this is relatively stable. Per our discussion: - stop KCL - decrease potassium rich foods - return for BMET early this upcoming week (Mon if possible, Tuesday if unable to come at that time) - decrease Xarelto back to 15mg  qsupper as Crcl 45.72ml/min - ensure eating regular meals, glucose 65 - not on any DM meds - keep f/u as planned - consider SLGT2i in follow-up, hold off additional loop diuretic given clinical stability, lack of symptoms, concomitant thiazide diuretic  She was grateful for call and verbalized understanding.

## 2023-03-28 ENCOUNTER — Other Ambulatory Visit (HOSPITAL_COMMUNITY): Payer: Self-pay

## 2023-03-28 DIAGNOSIS — I5189 Other ill-defined heart diseases: Secondary | ICD-10-CM | POA: Diagnosis not present

## 2023-03-28 LAB — BASIC METABOLIC PANEL
BUN/Creatinine Ratio: 18 (ref 12–28)
BUN: 17 mg/dL (ref 8–27)
CO2: 27 mmol/L (ref 20–29)
Calcium: 9.2 mg/dL (ref 8.7–10.3)
Chloride: 103 mmol/L (ref 96–106)
Creatinine, Ser: 0.92 mg/dL (ref 0.57–1.00)
Glucose: 84 mg/dL (ref 70–99)
Potassium: 5.1 mmol/L (ref 3.5–5.2)
Sodium: 141 mmol/L (ref 134–144)
eGFR: 63 mL/min/{1.73_m2} (ref >59)

## 2023-04-02 NOTE — Progress Notes (Unsigned)
 Cardiology Office Note    Date:  04/04/2023  ID:  RAISA DITTO, DOB 1940-12-23, MRN [id number] PCP:  Renford Dills, MD  Cardiologist:  Armanda Magic, MD  Electrophysiologist:  None   Chief Complaint: f/u afib/flutter, volume  History of Present Illness: .    Teresa White is a 83 y.o. female with visit-pertinent history of persistent atrial fib/flutter, bradycardia, pulmonary HTN, mitral regurgitation, PACs, mild aortic dilation, HTN, HLD, depression, anxiety, back pain seen for 6 month follow-up.   Last ischemic assessment was by nuc 2016 which was normal, but unable to gate due to frequent PACs. EF was 50-55% in 2015. Her arrhythmia appears paroxysmal in nature. In 06/2022 she was in slow atrial flutter in the 40s prompting reduction in metoprolol. When she saw AF clinic back in 09/2022 she was back in NSR (SB) 47bpm so metoprolol dose was reduced further. I met her 01/2023 at which time HR was still borderline low so metoprolol was further reduced, in coarse afib/flutter but asymptomatic. F/u echo showed EF 50-55% (57% by 3d volume) similarly described in 2015, moderately elevated PASP, severe BAE, moderate MR, mild AI, borderline dilation of ascending aorta, dilated IVC. Given this and elevated BNP she was given short course of Lasix. We also had to adjust her potassium supplementation for both high and low readings (ultimately discontinued) and decreased Xarelto dose due to CrCl. She subsequently felt well in follow-up.  She is seen back for follow-up feeling great without any CP, SOB, palpitations, dizziness, syncope, orthopnea or edema. She is unsure if she snores. She has never been told she stops breathing while sleeping. HR is 44bpm in coarse fib/flutter today. She is completely asymptomatic. Weight is 136lb, similar to 09/2022 (01/2023 weight was a stated weight) - as mentioned in prior OV she has been gradually losing weight and was encouraged to f/u PCP for this - previously in  the 180s-190s.  Labwork independently reviewed: 03/2023 K 5,1 Cr 0.92 02/2023 Mg OK 01/2023 TSH OK, CBC OK  ROS: .    Please see the history of present illness.  All other systems are reviewed and otherwise negative.  Studies Reviewed: Marland Kitchen    EKG:  EKG is ordered today, personally reviewed, demonstrating atrial flutter with CVR 44bpm, right axis deviation, prior anterior infarct, nonspecific STTW changes. Not crossing over into MUSE - sent for scanning.  CV Studies: Cardiac studies reviewed are outlined and summarized above. Otherwise please see EMR for full report.   Current Reported Medications:.    Current Meds  Medication Sig   acetaminophen (TYLENOL) 500 MG tablet Take 2 tablets (1,000 mg total) by mouth every 6 (six) hours as needed for mild pain.   ALPRAZolam (XANAX) 0.25 MG tablet Take 0.25 mg by mouth as needed for anxiety.    amLODipine (NORVASC) 5 MG tablet Take 1 tablet by mouth daily.   Cholecalciferol 50 MCG (2000 UT) TABS daily at 6 (six) AM.   docusate sodium (COLACE) 100 MG capsule Take 1 capsule (100 mg total) by mouth 2 (two) times daily. (Patient taking differently: Take 100 mg by mouth as needed.)   loratadine (CLARITIN) 10 MG tablet Take 10 mg by mouth as needed.   losartan-hydrochlorothiazide (HYZAAR) 100-25 MG tablet Take 1 tablet by mouth daily.   methocarbamol (ROBAXIN) 500 MG tablet Take 1 tablet (500 mg total) by mouth every 8 (eight) hours as needed for muscle spasms.   metoprolol tartrate (LOPRESSOR) 25 MG tablet Take 0.5 tablets (12.5 mg total)  by mouth 2 (two) times daily.   polyethylene glycol (MIRALAX / GLYCOLAX) 17 g packet Take 17 g by mouth daily.   Rivaroxaban (XARELTO) 15 MG TABS tablet Take 1 tablet (15 mg total) by mouth daily with supper.   sertraline (ZOLOFT) 25 MG tablet Take 25 mg by mouth daily.   simvastatin (ZOCOR) 20 MG tablet Take 20 mg by mouth every evening.   sodium chloride (OCEAN) 0.65 % nasal spray Place 2 sprays into the nose as  needed.   VITAMIN D PO Take 1 tablet by mouth daily.     Physical Exam:    VS:  BP 136/68   Pulse (!) 44   Ht 5\' 4"  (1.626 m)   Wt 136 lb 9.6 oz (62 kg)   SpO2 100%   BMI 23.45 kg/m    Wt Readings from Last 3 Encounters:  04/04/23 136 lb 9.6 oz (62 kg)  02/16/23 125 lb (56.7 kg) stated weight  09/23/22 135 lb 12.8 oz (61.6 kg)    GEN: Well nourished, well developed in no acute distress NECK: No JVD; No carotid bruits CARDIAC: irregularly irregular, rate controlled, no murmurs, rubs, gallops RESPIRATORY:  Clear to auscultation without rales, wheezing or rhonchi  ABDOMEN: Soft, non-tender, non-distended EXTREMITIES:  No edema; No acute deformity   Asessement and Plan:.    1. Persistent atrial fib/flutter with bradycardia - remains in coarse afib/flutter today with HR 44bpm. HR trends 45-low 50s with subsequent recheck. Will stop metoprolol completely and refer to EP to help evaluate plan for arrhythmia management. She may end up needing consideration of PPM though thankfully is completely asymptomatic at this time. Update BMET, TSH. Xarelto dose was recently adjusted back/forth, will reassess on updated labs. ER precautions reviewed.  2. Moderate pHTN with moderate MR - etiology of pHTN not clear at this time. EF 50-55% by recent echo, similar to 2015. Recent BNP slightly elevated along with dilated IVC, was given short course of Lasix (not prescribed long term given that she is also on thiazide diuretic). Question whether the elevated BNP is related to atrial stretch from her atrial arrhythmia. She feels great and appears euvolemic on exam at this time so hold off further diuresis. We discussed pursuing sleep study for evaluation and she politely declined. Will continue current regimen except hold metoprolol above. Re-evaluate volume status at each visit. Consider SGLT2i once HR issues more stable and consider 6-12 month echo in follow-up.   3. Essential HTN - SBP slightly elevated  today but given her bradycardia I would not be too aggressive at this time. Stopping metoprolol as above. We can revisit more target-based treatment once bradycardia has improved. Continue amlodipine 5mg  daily and losartan-hydrochlorothiazide 100mg /25mg  daily.   4. Borderline dilation of ascending aorta - anticipate f/u echo 02/2024, can arrange closer to that time.    Disposition: F/u with EP as above, and 3-86mo f/u with me.  Signed, Laurann Montana, PA-C

## 2023-04-04 ENCOUNTER — Encounter: Payer: Self-pay | Admitting: Physician Assistant

## 2023-04-04 ENCOUNTER — Ambulatory Visit: Payer: Medicare Other | Attending: Physician Assistant | Admitting: Physician Assistant

## 2023-04-04 VITALS — BP 136/68 | HR 44 | Ht 64.0 in | Wt 136.6 lb

## 2023-04-04 DIAGNOSIS — R001 Bradycardia, unspecified: Secondary | ICD-10-CM

## 2023-04-04 DIAGNOSIS — I77819 Aortic ectasia, unspecified site: Secondary | ICD-10-CM | POA: Diagnosis not present

## 2023-04-04 DIAGNOSIS — I272 Pulmonary hypertension, unspecified: Secondary | ICD-10-CM | POA: Diagnosis not present

## 2023-04-04 DIAGNOSIS — I340 Nonrheumatic mitral (valve) insufficiency: Secondary | ICD-10-CM | POA: Diagnosis not present

## 2023-04-04 DIAGNOSIS — I4819 Other persistent atrial fibrillation: Secondary | ICD-10-CM

## 2023-04-04 DIAGNOSIS — I10 Essential (primary) hypertension: Secondary | ICD-10-CM | POA: Diagnosis not present

## 2023-04-04 NOTE — Patient Instructions (Addendum)
 Medication Instructions:  Your physician has recommended you make the following change in your medication:   STOP Metoprolol  *If you need a refill on your cardiac medications before your next appointment, please call your pharmacy*   Lab Work: TODAY:  BMET & TSH RFX ON ABNORMAL TO FREE T4  If you have labs (blood work) drawn today and your tests are completely normal, you will receive your results only by: MyChart Message (if you have MyChart) OR A paper copy in the mail If you have any lab test that is abnormal or we need to change your treatment, we will call you to review the results.   Testing/Procedures: None ordered  You have been referred to Electrophysiology for Bradycardia  Follow-Up: At [facility], you and your health needs are our priority.  As part of our continuing mission to provide you with exceptional heart care, we have created designated Provider Care Teams.  These Care Teams include your primary Cardiologist (physician) and Advanced Practice Providers (APPs -  Physician Assistants and Nurse Practitioners) who all work together to provide you with the care you need, when you need it.  We recommend signing up for the patient portal called "MyChart".  Sign up information is provided on this After Visit Summary.  MyChart is used to connect with patients for Virtual Visits (Telemedicine).  Patients are able to view lab/test results, encounter notes, upcoming appointments, etc.  Non-urgent messages can be sent to your provider as well.   To learn more about what you can do with MyChart, go to [email or web address].    Your next appointment:   3-4 month(s)  Provider:   Ronie Spies, PA-C         Other Instructions     1st Floor: - Lobby - Registration  - Pharmacy  - Lab - Cafe  2nd Floor: - PV Lab - Diagnostic Testing (echo, CT, nuclear med)  3rd Floor: - Vacant  4th Floor: - TCTS (cardiothoracic surgery) - AFib Clinic - Structural Heart  Clinic - Vascular Surgery  - Vascular Ultrasound  5th Floor: - HeartCare Cardiology (general and EP) - Clinical Pharmacy for coumadin, hypertension, lipid, weight-loss medications, and med management appointments    Valet parking services will be available as well.

## 2023-04-05 LAB — BASIC METABOLIC PANEL
BUN/Creatinine Ratio: 18 (ref 12–28)
BUN: 17 mg/dL (ref 8–27)
CO2: 25 mmol/L (ref 20–29)
Calcium: 10.3 mg/dL (ref 8.7–10.3)
Chloride: 103 mmol/L (ref 96–106)
Creatinine, Ser: 0.94 mg/dL (ref 0.57–1.00)
Glucose: 78 mg/dL (ref 70–99)
Potassium: 5.1 mmol/L (ref 3.5–5.2)
Sodium: 146 mmol/L — ABNORMAL HIGH (ref 134–144)
eGFR: 61 mL/min/{1.73_m2} (ref >59)

## 2023-04-05 LAB — TSH RFX ON ABNORMAL TO FREE T4: TSH: 2.23 u[IU]/mL (ref 0.450–4.500)

## 2023-04-07 ENCOUNTER — Ambulatory Visit: Attending: Cardiology | Admitting: Cardiology

## 2023-04-07 ENCOUNTER — Encounter: Payer: Self-pay | Admitting: Cardiology

## 2023-04-07 VITALS — BP 140/80 | HR 54 | Ht 64.0 in | Wt 132.0 lb

## 2023-04-07 DIAGNOSIS — R001 Bradycardia, unspecified: Secondary | ICD-10-CM | POA: Diagnosis not present

## 2023-04-07 DIAGNOSIS — I4819 Other persistent atrial fibrillation: Secondary | ICD-10-CM

## 2023-04-07 NOTE — Patient Instructions (Signed)
 Medication Instructions:  Your physician recommends that you continue on your current medications as directed. Please refer to the Current Medication list given to you today.  *If you need a refill on your cardiac medications before your next appointment, please call your pharmacy*  Follow-Up: At [facility], you and your health needs are our priority.  As part of our continuing mission to provide you with exceptional heart care, we have created designated Provider Care Teams.  These Care Teams include your primary Cardiologist (physician) and Advanced Practice Providers (APPs -  Physician Assistants and Nurse Practitioners) who all work together to provide you with the care you need, when you need it.  Your next appointment:   As needed with Dr. Lalla Brothers

## 2023-04-07 NOTE — Progress Notes (Signed)
  Electrophysiology Office Note:    Date:  04/07/2023   ID:  Teresa White, DOB 1940/01/25, MRN [id number]  [facility] Cardiologist:  Armanda Magic, MD  [facility] Electrophysiologist:  None   Referring MD: Laurann Montana, PA-C   Chief Complaint: Bradycardia  History of Present Illness:    Teresa White is an 83 year old woman who I am seeing today for an evaluation of bradycardia at the request of Lucile Crater, PA-C.  She has a history of persistent atrial fibrillation and flutter, pulmonary hypertension, mitral regurgitation, PACs, hypertension, hyperlipidemia.  She was last seen by [facility] April 04, 2023.  She has had slowly conducted atrial arrhythmias with rates into the 40s.  These lower rates have been asymptomatic.  This is led to nodal blockers being reduced.  She is doing very well.  She walks over an hour and a half daily.  She helps take care of neighbors.  She has no limitations in her exercise capacity.  No presyncope or syncope.  No heart failure symptoms.     Their past medical, social and family history was reviewed.   ROS:   Please see the history of present illness.    All other systems reviewed and are negative.  EKGs/Labs/Other Studies Reviewed:    The following studies were reviewed today:  April 04, 2023 EKG shows coarse atrial fibrillation with a ventricular rate of 44 bpm  February 16, 2023 EKG shows atrial fibrillation with slow ventricular rate, 56 bpm  September 23, 2022 EKG shows sinus bradycardia, first-degree AV delay  March 04, 2023 echo EF 50 to 55% RV normal Severely dilated left and right atrium Moderate MR  EKG Interpretation Date/Time:  Thursday April 07 2023 14:26:37 EDT Ventricular Rate:  54 PR Interval:    QRS Duration:  92 QT Interval:  390 QTC Calculation: 369 R Axis:   111  Text Interpretation: Atrial flutter with variable A-V block Confirmed by Steffanie Dunn [phone]) on 04/07/2023 2:49:20 PM    Physical Exam:     VS:  BP (!) 140/80 (BP Location: Left Arm, Patient Position: Sitting, Cuff Size: Normal)   Pulse (!) 54   Ht 5\' 4"  (1.626 m)   Wt 132 lb (59.9 kg)   SpO2 98%   BMI 22.66 kg/m     Wt Readings from Last 3 Encounters:  04/07/23 132 lb (59.9 kg)  04/04/23 136 lb 9.6 oz (62 kg)  02/16/23 125 lb (56.7 kg)     GEN: no distress.  Appears younger than stated age CARD: Irregularly irregular, No MRG RESP: No IWOB. CTAB.        ASSESSMENT AND PLAN:    1. Persistent atrial fibrillation (HCC)   2. Bradycardia     #Atrial fibrillation Persistent.  Asymptomatic.  Management has been complicated by bradycardia. Discussed the possibility of requiring permanent pacemaker implant should we employee antiarrhythmics.  Discussed using nonpharmacologic options to treat her atrial fibrillation including catheter ablation.  For now, I have recommended a conservative management strategy.  Would continue Xarelto for stroke prophylaxis.  I do not recommend any antiarrhythmics or catheter ablation at this time.  I have encouraged her to remain active.  Follow-up with EP on an as-needed basis.        Signed, Rossie Muskrat. Lalla Brothers, MD, [facility], [facility] 04/07/2023 2:57 PM    Electrophysiology [facility]

## 2023-04-16 LAB — BASIC METABOLIC PANEL WITH GFR
BUN/Creatinine Ratio: 24 (ref 12–28)
BUN: 22 mg/dL (ref 8–27)
CO2: 22 mmol/L (ref 20–29)
Calcium: 9.5 mg/dL (ref 8.7–10.3)
Chloride: 105 mmol/L (ref 96–106)
Creatinine, Ser: 0.93 mg/dL (ref 0.57–1.00)
Glucose: 65 mg/dL — ABNORMAL LOW (ref 70–99)
Potassium: 5.7 mmol/L — ABNORMAL HIGH (ref 3.5–5.2)
Sodium: 143 mmol/L (ref 134–144)
eGFR: 62 mL/min/{1.73_m2} (ref >59)

## 2023-04-16 LAB — SPECIMEN STATUS REPORT

## 2023-06-09 DIAGNOSIS — D6869 Other thrombophilia: Secondary | ICD-10-CM | POA: Diagnosis not present

## 2023-06-09 DIAGNOSIS — I10 Essential (primary) hypertension: Secondary | ICD-10-CM | POA: Diagnosis not present

## 2023-06-09 DIAGNOSIS — I77810 Thoracic aortic ectasia: Secondary | ICD-10-CM | POA: Diagnosis not present

## 2023-06-09 DIAGNOSIS — I480 Paroxysmal atrial fibrillation: Secondary | ICD-10-CM | POA: Diagnosis not present

## 2023-06-09 DIAGNOSIS — M810 Age-related osteoporosis without current pathological fracture: Secondary | ICD-10-CM | POA: Diagnosis not present

## 2023-06-09 DIAGNOSIS — Z0000 Encounter for general adult medical examination without abnormal findings: Secondary | ICD-10-CM | POA: Diagnosis not present

## 2023-06-09 DIAGNOSIS — E7800 Pure hypercholesterolemia, unspecified: Secondary | ICD-10-CM | POA: Diagnosis not present

## 2023-06-10 ENCOUNTER — Other Ambulatory Visit: Payer: Self-pay | Admitting: Internal Medicine

## 2023-06-10 DIAGNOSIS — Z1231 Encounter for screening mammogram for malignant neoplasm of breast: Secondary | ICD-10-CM

## 2023-06-27 DIAGNOSIS — M8588 Other specified disorders of bone density and structure, other site: Secondary | ICD-10-CM | POA: Diagnosis not present

## 2023-06-27 DIAGNOSIS — Z1231 Encounter for screening mammogram for malignant neoplasm of breast: Secondary | ICD-10-CM | POA: Diagnosis not present

## 2023-07-18 ENCOUNTER — Ambulatory Visit: Admitting: Physician Assistant

## 2023-07-24 NOTE — Progress Notes (Unsigned)
 Cardiology Office Note    Date:  07/26/2023  ID:  Teresa White, DOB Mar 09, 1940, MRN [id number] PCP:  Rexanne Ingle, MD  Cardiologist:  Wilbert Bihari, MD  Electrophysiologist:  None   Chief Complaint: Follow up for atrial fibrillation, pulmonary HTN   History of Present Illness: .    Teresa White is a 83 y.o. female with visit-pertinent history of persistent atrial fibs/flutter, bradycardia, pulmonary hypertension, mitral regurgitation, PACs, mild aortic dilation, hypertension, hyperlipidemia, depression, anxiety, back pain.   Last ischemic assessment with nuclear stress test in 2016 was normal but unable to gait due to frequent PACs.  EF was 50 to 55% in 2015.  Arrhythmia appears paroxysmal nature.  In 06/2022 she was in slow atrial flutter in the 40s prompting reduction metoprolol .  When she was seen in A-fib clinic in 09/2022 she was back in normal sinus rhythm at 47 bpm so metoprolol  was reduced further.  Patient was seen in clinic in 01/2023, heart rate was still borderline low so metoprolol  was reduced further, she was in coarse A-fib/flutter but asymptomatic.  Follow-up echo showed EF 50 to 55%, similar described in 2015 with moderately elevated PASP, severe BAE, moderate MR, mild AI, borderline dilation of the ascending aorta, dilated IVC.  Given results and elevated BNP she was given a short course of Lasix .  Her Xarelto  was decreased due to creatinine clearance.  Patient was last seen in clinic by general cardiology on 04/04/2023, patient reported that she was feeling great.  Her heart rate was 44 bpm and coarse fib/flutter and was completely asymptomatic.  Patient's metoprolol  was discontinued and she was referred to electrophysiology for follow-up.  Patient was seen by Dr. Cindie on 04/07/2023.  It was noted she had slowly conducted atrial arrhythmias with rates in the 40s, she was asymptomatic.  It was reported that she was walking over an hour and a half daily and helping care for  her neighbors.  Possibility of requiring permanent pacemaker implant was discussed if antiarrhythmics were employed.  It was recommended she continue with a conservative management and continue on Xarelto  for stroke prophylaxis.  Today patient presents for follow-up.  She reports that she is doing very well overall.  She denies any chest pain, shortness of breath, lower extreme edema, orthopnea or PND.  Patient reports that she remains very active assisting her neighbors, reports that typically she walks outside 3 times a day for 30 minutes, notes she has had to cut back on this slightly in recent weeks given the heat and rain.  Patient denies any cardiac concerns or complaints today.  She denies any palpitations, dizziness, lightheadedness, presyncope or syncope.  Labwork independently reviewed: 06/09/2023: Hemoglobin 13.6, hematocrit 33.8, creatinine 0.99, potassium 4.2, sodium 141, AST 21, ALT 9, TSH 1.31 ROS: .   Today she denies chest pain, shortness of breath, lower extremity edema, fatigue, palpitations, melena, hematuria, hemoptysis, diaphoresis, weakness, presyncope, syncope, orthopnea, and PND.  All other systems are reviewed and otherwise negative. Studies Reviewed: SABRA   EKG:  EKG is ordered today, personally reviewed, demonstrating  EKG Interpretation Date/Time:  Tuesday July 26 2023 07:47:50 EDT Ventricular Rate:  46 PR Interval:    QRS Duration:  102 QT Interval:  464 QTC Calculation: 406 R Axis:   78  Text Interpretation: Atrial fibrillation with slow ventricular response When compared with ECG of 07-Apr-2023 14:26, Atrial fibrillation has replaced Atrial flutter Nonspecific T wave abnormality no longer evident in Inferior leads T wave inversion no longer  evident in Anterior leads Confirmed by Anacleto Batterman [phone]) on 07/26/2023 8:20:32 AM   CV Studies: Cardiac studies reviewed are outlined and summarized above. Otherwise please see EMR for full report. Cardiac Studies & Procedures    ______________________________________________________________________________________________   STRESS TESTS  MYOCARDIAL PERFUSION IMAGING 08/26/2014  Interpretation Summary  The study is normal. No ischemia  This is a low risk study.  Unable to gate secondary to frequent PAC's  Breast attenuation noted (implants), reduces sensitivity of study.   ECHOCARDIOGRAM  ECHOCARDIOGRAM COMPLETE 03/04/2023  Narrative ECHOCARDIOGRAM REPORT    Patient Name:   Teresa White [facility] Date of Exam: 03/04/2023 Medical Rec #:  [id number]          Height:       63.0 in Accession #:    [id number]         Weight:       125.0 lb Date of Birth:  Dec 03, 1940          BSA:          1.584 m Patient Age:    82 years           BP:           130/68 mmHg Patient Gender: F                  HR:           46 bpm. Exam Location:  [address]  Procedure: 2D Echo and 3D Echo (Both Spectral and Color Flow Doppler were utilized during procedure).  Indications:    Dilation of aorta [facility]) [[id number]]  History:        Patient has prior history of Echocardiogram examinations, most recent 07/19/2013. Arrythmias:Atrial Fibrillation and PVC; Risk Factors:Hypertension.  Sonographer:    Lauraine Pilot RDCS Referring Phys: [id number] DAYNA N DUNN  IMPRESSIONS   1. Left ventricular ejection fraction, by estimation, is 50 to 55%. Left ventricular ejection fraction by 3D volume is 57 %. The left ventricle has low normal function. The left ventricle has no regional wall motion abnormalities. Left ventricular diastolic parameters are indeterminate. 2. Right ventricular systolic function is normal. The right ventricular size is normal. There is moderately elevated pulmonary artery systolic pressure. The estimated right ventricular systolic pressure is 52.1 mmHg. 3. Left atrial size was severely dilated. 4. Right atrial size was severely dilated. 5. The mitral valve is normal in structure. Moderate mitral valve regurgitation. No  evidence of mitral stenosis. 6. The aortic valve is tricuspid. There is mild calcification of the aortic valve. Aortic valve regurgitation is mild. No aortic stenosis is present. 7. Aortic dilatation noted. There is borderline dilatation of the ascending aorta, measuring 38 mm. 8. The inferior vena cava is dilated in size with >50% respiratory variability, suggesting right atrial pressure of 8 mmHg.  FINDINGS Left Ventricle: Left ventricular ejection fraction, by estimation, is 50 to 55%. Left ventricular ejection fraction by 3D volume is 57 %. The left ventricle has low normal function. The left ventricle has no regional wall motion abnormalities. Strain imaging was not performed. The left ventricular internal cavity size was normal in size. There is no left ventricular hypertrophy. Left ventricular diastolic parameters are indeterminate.  Right Ventricle: The right ventricular size is normal. No increase in right ventricular wall thickness. Right ventricular systolic function is normal. There is moderately elevated pulmonary artery systolic pressure. The tricuspid regurgitant velocity is 3.32 m/s, and with an assumed right atrial pressure of 8 mmHg, the estimated right ventricular  systolic pressure is 52.1 mmHg.  Left Atrium: Left atrial size was severely dilated.  Right Atrium: Right atrial size was severely dilated.  Pericardium: There is no evidence of pericardial effusion.  Mitral Valve: The mitral valve is normal in structure. Moderate mitral valve regurgitation, with centrally-directed jet. No evidence of mitral valve stenosis.  Tricuspid Valve: The tricuspid valve is normal in structure. Tricuspid valve regurgitation is mild . No evidence of tricuspid stenosis.  Aortic Valve: The aortic valve is tricuspid. There is mild calcification of the aortic valve. Aortic valve regurgitation is mild. Aortic regurgitation PHT measures 662 msec. No aortic stenosis is present.  Pulmonic Valve: The  pulmonic valve was normal in structure. Pulmonic valve regurgitation is mild. No evidence of pulmonic stenosis.  Aorta: Aortic dilatation noted. There is borderline dilatation of the ascending aorta, measuring 38 mm.  Venous: The inferior vena cava is dilated in size with greater than 50% respiratory variability, suggesting right atrial pressure of 8 mmHg.  IAS/Shunts: No atrial level shunt detected by color flow Doppler.  Additional Comments: 3D was performed not requiring image post processing on an independent workstation and was normal.   LEFT VENTRICLE PLAX 2D LVIDd:         5.30 cm LVIDs:         3.90 cm LV PW:         0.80 cm         3D Volume EF LV IVS:        0.80 cm         LV 3D EF:    Left LVOT diam:     1.80 cm                      ventricul LV SV:         67                           ar LV SV Index:   42                           ejection LVOT Area:     2.54 cm                     fraction by 3D volume is 57 %.  3D Volume EF: 3D EF:        57 % LV EDV:       154 ml LV ESV:       66 ml LV SV:        88 ml  RIGHT VENTRICLE RV S prime:     12.10 cm/s TAPSE (M-mode): 2.1 cm  LEFT ATRIUM             Index        RIGHT ATRIUM           Index LA diam:        5.10 cm 3.22 cm/m   RA Area:     19.40 cm LA Vol (A2C):   89.3 ml 56.39 ml/m  RA Volume:   54.10 ml  34.16 ml/m LA Vol (A4C):   84.0 ml 53.05 ml/m LA Biplane Vol: 90.4 ml 57.09 ml/m AORTIC VALVE                   PULMONIC VALVE LVOT Vmax:         95.67 cm/s  PR End Diast Vel: 5.86 msec LVOT Vmean:        56.233 cm/s LVOT VTI:          0.262 m AI PHT:            662 msec AR Vena Contracta: 0.20 cm  AORTA Ao Root diam: 3.30 cm Ao Asc diam:  3.80 cm  MR Peak grad:    115.3 mmHg   TRICUSPID VALVE MR Mean grad:    84.0 mmHg    TR Peak grad:   44.1 mmHg MR Vmax:         537.00 cm/s  TR Vmax:        332.00 cm/s MR Vmean:        436.5 cm/s MR PISA:         1.37 cm     SHUNTS MR PISA Eff ROA: 10 mm        Systemic VTI:  0.26 m MR PISA Radius:  0.47 cm      Systemic Diam: 1.80 cm  Toribio Fuel MD Electronically signed by Toribio Fuel MD Signature Date/Time: 03/04/2023/3:57:11 PM    Final          ______________________________________________________________________________________________      Current Reported Medications:.    Current Meds  Medication Sig   acetaminophen  (TYLENOL ) 500 MG tablet Take 2 tablets (1,000 mg total) by mouth every 6 (six) hours as needed for mild pain.   ALPRAZolam  (XANAX ) 0.25 MG tablet Take 0.25 mg by mouth as needed for anxiety.    amLODipine (NORVASC) 5 MG tablet Take 1 tablet by mouth daily.   Cholecalciferol 50 MCG (2000 UT) TABS daily at 6 (six) AM.   docusate sodium  (COLACE) 100 MG capsule Take 1 capsule (100 mg total) by mouth 2 (two) times daily. (Patient taking differently: Take 100 mg by mouth as needed.)   loratadine (CLARITIN) 10 MG tablet Take 10 mg by mouth as needed.   losartan-hydrochlorothiazide  (HYZAAR) 100-25 MG tablet Take 1 tablet by mouth daily.   methocarbamol  (ROBAXIN ) 500 MG tablet Take 1 tablet (500 mg total) by mouth every 8 (eight) hours as needed for muscle spasms.   polyethylene glycol (MIRALAX  / GLYCOLAX ) 17 g packet Take 17 g by mouth daily.   Rivaroxaban  (XARELTO ) 15 MG TABS tablet Take 1 tablet (15 mg total) by mouth daily with supper.   sertraline (ZOLOFT) 25 MG tablet Take 25 mg by mouth daily.   simvastatin  (ZOCOR ) 20 MG tablet Take 20 mg by mouth every evening.   sodium chloride  (OCEAN) 0.65 % nasal spray Place 2 sprays into the nose as needed.   VITAMIN D PO Take 1 tablet by mouth daily.    Physical Exam:    VS:  BP 116/72   Pulse (!) 56   Ht 5' 4 (1.626 m)   Wt 140 lb 12.8 oz (63.9 kg)   SpO2 97%   BMI 24.17 kg/m    Wt Readings from Last 3 Encounters:  07/26/23 140 lb 12.8 oz (63.9 kg)  04/07/23 132 lb (59.9 kg)  04/04/23 136 lb 9.6 oz (62 kg)    GEN: Well nourished, well developed in no  acute distress NECK: No JVD; No carotid bruits CARDIAC: Irregular/slow rate irregular rhythm, no murmurs, rubs, gallops RESPIRATORY:  Clear to auscultation without rales, wheezing or rhonchi  ABDOMEN: Soft, non-tender, non-distended EXTREMITIES:  No edema; No acute deformity     Asessement and Plan:.   Persistent atrial fib/flutter with bradycardia: Patient with  history of persistent atrial fibrillation/flutter with slow ventricular response.  Patient has been seen by Dr. Cindie, possibility of requiring permanent pacemaker implant was discussed, she proceeded with conservative management.  Patient reports that she is doing well, denies any dizziness, lightheadedness, presyncope, syncope or fatigue.  She remains very active typically walking at least 3 times a day for 30 minutes, tolerates very well.  She will continue to monitor for symptoms and notify the office if present.  Reviewed ED precautions.  Continue Xarelto  15 mg daily, patient denies any bleeding problems.  Moderate pHTN with modearte MR: EF 55% by recent echo, similar to 2015.  BNP was elevated earlier this year along with dilated IVC, she was given a short course of Lasix . It was questioned whether the elevated BNP was related to atrial stretched from her atrial arrhythmia. Today she reports that she is feeling well, denies any lower extremity edema, shortness of breath, orthopnea or PND.  Patient politely declined any changes today including a sleep study or SGLT2 inhibitor.  Encouraged patient to monitor for increased shortness of breath and lower extremity edema.  Hypertension: Blood pressure today 116/72.  Continue amlodipine 5 mg daily and losartan-hydrochlorothiazide  100-25 mg daily.  Borderline dilation of the ascending aorta: Noted on echo earlier in the year. Plan for follow-up echo in 02/2024, to be ordered on follow up.     Disposition: F/u with Dr. Shlomo or Avontae Burkhead, NP in 4-5 months or sooner if needed.    Signed, Pixie Burgener D Fenix Rorke, NP

## 2023-07-26 ENCOUNTER — Ambulatory Visit: Attending: Cardiology | Admitting: Cardiology

## 2023-07-26 ENCOUNTER — Encounter: Payer: Self-pay | Admitting: Cardiology

## 2023-07-26 VITALS — BP 116/72 | HR 56 | Ht 64.0 in | Wt 140.8 lb

## 2023-07-26 DIAGNOSIS — I4819 Other persistent atrial fibrillation: Secondary | ICD-10-CM | POA: Diagnosis not present

## 2023-07-26 DIAGNOSIS — I340 Nonrheumatic mitral (valve) insufficiency: Secondary | ICD-10-CM | POA: Diagnosis not present

## 2023-07-26 DIAGNOSIS — I10 Essential (primary) hypertension: Secondary | ICD-10-CM

## 2023-07-26 DIAGNOSIS — I272 Pulmonary hypertension, unspecified: Secondary | ICD-10-CM | POA: Diagnosis not present

## 2023-07-26 DIAGNOSIS — R001 Bradycardia, unspecified: Secondary | ICD-10-CM

## 2023-07-26 DIAGNOSIS — I77819 Aortic ectasia, unspecified site: Secondary | ICD-10-CM

## 2023-07-26 NOTE — Patient Instructions (Addendum)
 Medication Instructions:  Your physician recommends that you continue on your current medications as directed. Please refer to the Current Medication list given to you today.  *If you need a refill on your cardiac medications before your next appointment, please call your pharmacy*  Lab Work: NONE ordered at this time of appointment   Testing/Procedures: NONE ordered at this time of appointment   Follow-Up: At [facility], you and your health needs are our priority.  As part of our continuing mission to provide you with exceptional heart care, our providers are all part of one team.  This team includes your primary Cardiologist (physician) and Advanced Practice Providers or APPs (Physician Assistants and Nurse Practitioners) who all work together to provide you with the care you need, when you need it.  Your next appointment:   4-5 month(s)  Provider:   Wilbert Bihari, MD    We recommend signing up for the patient portal called MyChart.  Sign up information is provided on this After Visit Summary.  MyChart is used to connect with patients for Virtual Visits (Telemedicine).  Patients are able to view lab/test results, encounter notes, upcoming appointments, etc.  Non-urgent messages can be sent to your provider as well.   To learn more about what you can do with MyChart, go to [email or web address].

## 2023-10-31 NOTE — Progress Notes (Unsigned)
 Date:  11/01/2023   ID:  EMIKO OSORTO, DOB Mar 25, 1940, MRN [id number]   PCP:  Rexanne Ingle, MD  Cardiologist:  Wilbert Bihari, MD  Electrophysiologist:  None   Chief Complaint:  Atrial fibrillation and HTN  History of Present Illness:    Teresa White is a 83 y.o. female  with a hx of persistent atrial fibrillation, bradycardia, pulmonary HTN, MR, PACs, HLD and HTN.   She has a hx of PACs in 2015 and then slow atrial flutter in 06/2022 requiring reduction in BB.  Followed in afib clinic 09/2022 was back in sinus brady and ultimately BB stopped due to ongoing bradycardia.  2D echo 1.2025 with EF 50-55%, moderate PHTN, severe BAE, moderate MR, mild AI. Seen by EP 03/2023 due to persistent atrial fib/flutter with SVR but was asymptomatic and able to walk >1 hr with no sx.  Possibility of requiring permanent pacemaker implant was discussed if antiarrhythmics were employed. It was recommended she continue with a conservative management and continue on Xarelto  for stroke prophylaxis.   Last seen by Katlyn West, NP 07/26/2023 and recommended further workup of her PHTN but patient refused sleep study.    She is here today for followup and is doing well.  She denies any CP or pressure, SOB, DOE, LE edema, dizziness, palpitations or syncope. She has had some chronic sinus issues.   Prior CV studies:   The following studies were reviewed today:  EKG and outside labs from PCP   Past Medical History:  Diagnosis Date   Anxiety    Back pain    history of abnormal MRI mild spinal stenosis at L3-4 and L4-5 small central disc herniation at L2-3 small L sided disc and Herniation at L1   Chronic atrial fibrillation (HCC)    Depression    Hypercholesteremia    Hypertension    Obesity (BMI 30-39.9) 10/16/2015   PVC (premature ventricular contraction)    Past Surgical History:  Procedure Laterality Date   ABDOMINAL SURGERY       Current Meds  Medication Sig   acetaminophen  (TYLENOL )  500 MG tablet Take 2 tablets (1,000 mg total) by mouth every 6 (six) hours as needed for mild pain.   amLODipine (NORVASC) 5 MG tablet Take 1 tablet by mouth daily.   Cholecalciferol 50 MCG (2000 UT) TABS daily at 6 (six) AM.   loratadine (CLARITIN) 10 MG tablet Take 10 mg by mouth as needed. (Patient taking differently: Take 10 mg by mouth daily.)   losartan-hydrochlorothiazide  (HYZAAR) 100-25 MG tablet Take 1 tablet by mouth daily.   Rivaroxaban  (XARELTO ) 15 MG TABS tablet Take 1 tablet (15 mg total) by mouth daily with supper.   simvastatin  (ZOCOR ) 20 MG tablet Take 20 mg by mouth every evening.   sodium chloride  (OCEAN) 0.65 % nasal spray Place 2 sprays into the nose as needed.   VITAMIN D PO Take 1 tablet by mouth daily.      Allergies:   Azithromycin, Demerol, Erythromycin, Demerol  [meperidine hcl], Alendronate sodium, Flonase [fluticasone propionate], Meperidine, and Vicodin [hydrocodone -acetaminophen ]   Social History   Tobacco Use   Smoking status: Former    Current packs/day: 0.00    Types: Cigarettes    Quit date: 01/18/1998    Years since quitting: 25.8   Smokeless tobacco: Never  Substance Use Topics   Alcohol use: No    Alcohol/week: 0.0 standard drinks of alcohol   Drug use: No     Family Hx: The  patient's family history includes Diabetes in her brother and sister; Kidney disease in her mother; Other in her father.  ROS:   Please see the history of present illness.     All other systems reviewed and are negative.   Labs/Other Tests and Data Reviewed:         Recent Labs: 02/16/2023: Hemoglobin 13.9; Platelets 263 03/07/2023: Magnesium 2.1 03/24/2023: NT-Pro BNP 1,556 04/04/2023: BUN 17; Creatinine, Ser 0.94; Potassium 5.1; Sodium 146; TSH 2.230   Recent Lipid Panel No results found for: CHOL, TRIG, HDL, CHOLHDL, LDLCALC, LDLDIRECT  Wt Readings from Last 3 Encounters:  11/01/23 141 lb 9.6 oz (64.2 kg)  07/26/23 140 lb 12.8 oz (63.9 kg)   04/07/23 132 lb (59.9 kg)     Objective:    Vital Signs:  BP 138/62   Pulse 76   Ht 5' 4 (1.626 m)   Wt 141 lb 9.6 oz (64.2 kg)   SpO2 97%   BMI 24.31 kg/m   GEN: Well nourished, well developed in no acute distress HEENT: Normal NECK: No JVD; No carotid bruits LYMPHATICS: No lymphadenopathy CARDIAC:RRR, no murmurs, rubs, gallops RESPIRATORY:  Clear to auscultation without rales, wheezing or rhonchi  ABDOMEN: Soft, non-tender, non-distended MUSCULOSKELETAL:  No edema; No deformity  SKIN: Warm and dry NEUROLOGIC:  Alert and oriented x 3 PSYCHIATRIC:  Normal affect  ASSESSMENT & PLAN:    Persistent PAF/flutter Bradycardia -remains in atrial fibrillation today with good HR control>>HR in the  -HR in the 70's today with no palpitations -no bleeding on DOAC -continue Xarelto  15mg  daily (dosed for reduced CrCL) with PRB refills -no BB due to ongoing bradycardia -evaluated by EP earlier in the year and no indication for PPM due to asymptomatic state -I have personally reviewed and interpreted outside labs performed by patient's PCP which showed serum creatinine 0.94 on 04/04/2023 and potassium 4.2, hemoglobin 13.6 on 06/09/2023 - Check CBC and edema  Hypertension  -Bp with good control at 138/62 mmHg -continue Amlodipine 5mg  daily, Hyzaar 100-25 mg daily with PRN refills  Pulmonary HTN -moderate by echo 02/2023 PASP with normal RV size and function>>likely Group 2 related to MR, pulmonary venous HTN -refused  further workup with sleep study, PFTs, VQ scan (unlikely to have PE in setting of chronic anticoagulation)  Mitral regurgitation -Moderate by echo 02/2023 -remains asymptomatic -likely plays a role in her PHTN -repeat echo 02/2024   Medication Adjustments/Labs and Tests Ordered: Current medicines are reviewed at length with the patient today.  Concerns regarding medicines are outlined above.  Tests Ordered: No orders of the defined types were placed in this  encounter.   Medication Changes: No orders of the defined types were placed in this encounter.    Disposition:  Follow up in 6 month(s)  Signed, Wilbert Bihari, MD  11/01/2023 8:17 AM    [facility]

## 2023-11-01 ENCOUNTER — Ambulatory Visit: Attending: Cardiology | Admitting: Cardiology

## 2023-11-01 ENCOUNTER — Encounter: Payer: Self-pay | Admitting: Cardiology

## 2023-11-01 VITALS — BP 138/62 | HR 76 | Ht 64.0 in | Wt 141.6 lb

## 2023-11-01 DIAGNOSIS — R001 Bradycardia, unspecified: Secondary | ICD-10-CM

## 2023-11-01 DIAGNOSIS — I272 Pulmonary hypertension, unspecified: Secondary | ICD-10-CM

## 2023-11-01 DIAGNOSIS — I340 Nonrheumatic mitral (valve) insufficiency: Secondary | ICD-10-CM

## 2023-11-01 DIAGNOSIS — I482 Chronic atrial fibrillation, unspecified: Secondary | ICD-10-CM | POA: Insufficient documentation

## 2023-11-01 DIAGNOSIS — I10 Essential (primary) hypertension: Secondary | ICD-10-CM | POA: Diagnosis not present

## 2023-11-01 DIAGNOSIS — I4819 Other persistent atrial fibrillation: Secondary | ICD-10-CM | POA: Diagnosis not present

## 2023-11-01 LAB — CBC

## 2023-11-01 NOTE — Patient Instructions (Signed)
 Medication Instructions:  Your physician recommends that you continue on your current medications as directed. Please refer to the Current Medication list given to you today.  *If you need a refill on your cardiac medications before your next appointment, please call your pharmacy*  Lab Work: TODAY: CBC, BMET If you have labs (blood work) drawn today and your tests are completely normal, you will receive your results only by: MyChart Message (if you have MyChart) OR A paper copy in the mail If you have any lab test that is abnormal or we need to change your treatment, we will call you to review the results.  Testing/Procedures: Your physician has requested that you have an echocardiogram in February 2026. Echocardiography is a painless test that uses sound waves to create images of your heart. It provides your doctor with information about the size and shape of your heart and how well your heart's chambers and valves are working. This procedure takes approximately one hour. There are no restrictions for this procedure. Please do NOT wear cologne, perfume, aftershave, or lotions (deodorant is allowed). Please arrive 15 minutes prior to your appointment time.  Please note: We ask at that you not bring children with you during ultrasound (echo/ vascular) testing. Due to room size and safety concerns, children are not allowed in the ultrasound rooms during exams. Our front office staff cannot provide observation of children in our lobby area while testing is being conducted. An adult accompanying a patient to their appointment will only be allowed in the ultrasound room at the discretion of the ultrasound technician under special circumstances. We apologize for any inconvenience.  Follow-Up: At [facility], you and your health needs are our priority.  As part of our continuing mission to provide you with exceptional heart care, our providers are all part of one team.  This team includes  your primary Cardiologist (physician) and Advanced Practice Providers or APPs (Physician Assistants and Nurse Practitioners) who all work together to provide you with the care you need, when you need it.  Your next appointment:   1 year(s)  Provider:   Wilbert Bihari, MD  We recommend signing up for the patient portal called MyChart.  Sign up information is provided on this After Visit Summary.  MyChart is used to connect with patients for Virtual Visits (Telemedicine).  Patients are able to view lab/test results, encounter notes, upcoming appointments, etc.  Non-urgent messages can be sent to your provider as well.    To learn more about what you can do with MyChart, go to [email or web address].

## 2023-11-01 NOTE — Addendum Note (Signed)
 Addended by: VICCI ROXIE CROME on: 11/01/2023 08:25 AM   Modules accepted: Orders

## 2023-11-02 ENCOUNTER — Ambulatory Visit: Payer: Self-pay | Admitting: Cardiology

## 2023-11-02 LAB — CBC
Hematocrit: 42.5 % (ref 34.0–46.6)
Hemoglobin: 13.6 g/dL (ref 11.1–15.9)
MCH: 30 pg (ref 26.6–33.0)
MCHC: 32 g/dL (ref 31.5–35.7)
MCV: 94 fL (ref 79–97)
Platelets: 303 x10E3/uL (ref 150–450)
RBC: 4.53 x10E6/uL (ref 3.77–5.28)
RDW: 12.3 % (ref 11.7–15.4)
WBC: 7.2 x10E3/uL (ref 3.4–10.8)

## 2023-11-02 LAB — BASIC METABOLIC PANEL WITH GFR
BUN/Creatinine Ratio: 12 (ref 12–28)
BUN: 11 mg/dL (ref 8–27)
CO2: 24 mmol/L (ref 20–29)
Calcium: 9.9 mg/dL (ref 8.7–10.3)
Chloride: 99 mmol/L (ref 96–106)
Creatinine, Ser: 0.9 mg/dL (ref 0.57–1.00)
Glucose: 85 mg/dL (ref 70–99)
Potassium: 4.7 mmol/L (ref 3.5–5.2)
Sodium: 138 mmol/L (ref 134–144)
eGFR: 64 mL/min/1.73 (ref >59)

## 2023-11-02 NOTE — Telephone Encounter (Signed)
 Pt calling for results. Please advise.

## 2023-11-03 NOTE — Telephone Encounter (Signed)
 Pt calling again for test results. Please advise.

## 2023-11-04 NOTE — Telephone Encounter (Signed)
 Attempted to call patient to discuss results. No answer on home phone, cell phone is not in service.

## 2023-11-04 NOTE — Telephone Encounter (Signed)
-----   Message from Wilbert Bihari sent at 11/02/2023  7:08 AM EDT ----- Please let patient know that labs were normal.  Continue current medical therapy. ----- Message ----- From: Interface, [facility] Lab Results In Sent: 11/01/2023  11:36 PM EDT To: Wilbert JONELLE Bihari, MD

## 2023-11-16 ENCOUNTER — Other Ambulatory Visit: Payer: Self-pay | Admitting: Physician Assistant

## 2023-11-16 DIAGNOSIS — I5189 Other ill-defined heart diseases: Secondary | ICD-10-CM

## 2024-03-15 ENCOUNTER — Ambulatory Visit (HOSPITAL_COMMUNITY)
